# Patient Record
Sex: Female | Born: 1945 | Hispanic: No | Marital: Married | Smoking: Current every day smoker
Health system: Southern US, Community
[De-identification: ages and names within clinical notes are randomized; demographics above are authoritative.]

## PROBLEM LIST (undated history)

## (undated) DIAGNOSIS — M069 Rheumatoid arthritis, unspecified: Secondary | ICD-10-CM

## (undated) DIAGNOSIS — I639 Cerebral infarction, unspecified: Secondary | ICD-10-CM

## (undated) DIAGNOSIS — I10 Essential (primary) hypertension: Secondary | ICD-10-CM

## (undated) DIAGNOSIS — Z5189 Encounter for other specified aftercare: Secondary | ICD-10-CM

## (undated) DIAGNOSIS — H269 Unspecified cataract: Secondary | ICD-10-CM

## (undated) DIAGNOSIS — M1990 Unspecified osteoarthritis, unspecified site: Secondary | ICD-10-CM

## (undated) DIAGNOSIS — N200 Calculus of kidney: Secondary | ICD-10-CM

## (undated) HISTORY — PX: SHOULDER SURGERY: SHX246

## (undated) HISTORY — DX: Unspecified cataract: H26.9

## (undated) HISTORY — DX: Cerebral infarction, unspecified: I63.9

## (undated) HISTORY — PX: ABDOMINAL HYSTERECTOMY: SHX81

---

## 2014-07-02 ENCOUNTER — Emergency Department (HOSPITAL_BASED_OUTPATIENT_CLINIC_OR_DEPARTMENT_OTHER): Payer: Medicare HMO

## 2014-07-02 ENCOUNTER — Encounter (HOSPITAL_BASED_OUTPATIENT_CLINIC_OR_DEPARTMENT_OTHER): Payer: Self-pay | Admitting: *Deleted

## 2014-07-02 ENCOUNTER — Emergency Department (HOSPITAL_BASED_OUTPATIENT_CLINIC_OR_DEPARTMENT_OTHER)
Admission: EM | Admit: 2014-07-02 | Discharge: 2014-07-03 | Payer: Medicare HMO | Attending: Emergency Medicine | Admitting: Emergency Medicine

## 2014-07-02 DIAGNOSIS — Z87442 Personal history of urinary calculi: Secondary | ICD-10-CM | POA: Diagnosis not present

## 2014-07-02 DIAGNOSIS — M069 Rheumatoid arthritis, unspecified: Secondary | ICD-10-CM | POA: Insufficient documentation

## 2014-07-02 DIAGNOSIS — I10 Essential (primary) hypertension: Secondary | ICD-10-CM | POA: Insufficient documentation

## 2014-07-02 DIAGNOSIS — K1120 Sialoadenitis, unspecified: Secondary | ICD-10-CM | POA: Insufficient documentation

## 2014-07-02 DIAGNOSIS — Z720 Tobacco use: Secondary | ICD-10-CM | POA: Diagnosis not present

## 2014-07-02 DIAGNOSIS — Z859 Personal history of malignant neoplasm, unspecified: Secondary | ICD-10-CM | POA: Insufficient documentation

## 2014-07-02 DIAGNOSIS — M542 Cervicalgia: Secondary | ICD-10-CM | POA: Insufficient documentation

## 2014-07-02 DIAGNOSIS — R600 Localized edema: Secondary | ICD-10-CM | POA: Diagnosis present

## 2014-07-02 DIAGNOSIS — Z79899 Other long term (current) drug therapy: Secondary | ICD-10-CM | POA: Diagnosis not present

## 2014-07-02 HISTORY — DX: Rheumatoid arthritis, unspecified: M06.9

## 2014-07-02 HISTORY — DX: Unspecified osteoarthritis, unspecified site: M19.90

## 2014-07-02 HISTORY — DX: Calculus of kidney: N20.0

## 2014-07-02 HISTORY — DX: Essential (primary) hypertension: I10

## 2014-07-02 HISTORY — DX: Encounter for other specified aftercare: Z51.89

## 2014-07-02 LAB — BASIC METABOLIC PANEL
ANION GAP: 13 (ref 5–15)
BUN: 20 mg/dL (ref 6–23)
CALCIUM: 9.6 mg/dL (ref 8.4–10.5)
CO2: 28 mEq/L (ref 19–32)
Chloride: 103 mEq/L (ref 96–112)
Creatinine, Ser: 1.2 mg/dL — ABNORMAL HIGH (ref 0.50–1.10)
GFR calc non Af Amer: 45 mL/min — ABNORMAL LOW (ref >90)
GFR, EST AFRICAN AMERICAN: 52 mL/min — AB (ref >90)
Glucose, Bld: 94 mg/dL (ref 70–99)
Potassium: 4 mEq/L (ref 3.7–5.3)
SODIUM: 144 meq/L (ref 137–147)

## 2014-07-02 LAB — CBC WITH DIFFERENTIAL/PLATELET
BASOS ABS: 0 10*3/uL (ref 0.0–0.1)
BASOS PCT: 0 % (ref 0–1)
Eosinophils Absolute: 0.2 10*3/uL (ref 0.0–0.7)
Eosinophils Relative: 3 % (ref 0–5)
HEMATOCRIT: 46.5 % — AB (ref 36.0–46.0)
Hemoglobin: 15.1 g/dL — ABNORMAL HIGH (ref 12.0–15.0)
Lymphocytes Relative: 20 % (ref 12–46)
Lymphs Abs: 1.5 10*3/uL (ref 0.7–4.0)
MCH: 30.5 pg (ref 26.0–34.0)
MCHC: 32.5 g/dL (ref 30.0–36.0)
MCV: 93.9 fL (ref 78.0–100.0)
MONO ABS: 0.8 10*3/uL (ref 0.1–1.0)
Monocytes Relative: 10 % (ref 3–12)
NEUTROS ABS: 5 10*3/uL (ref 1.7–7.7)
Neutrophils Relative %: 67 % (ref 43–77)
PLATELETS: 194 10*3/uL (ref 150–400)
RBC: 4.95 MIL/uL (ref 3.87–5.11)
RDW: 13.1 % (ref 11.5–15.5)
WBC: 7.5 10*3/uL (ref 4.0–10.5)

## 2014-07-02 MED ORDER — CLINDAMYCIN PHOSPHATE 600 MG/50ML IV SOLN
600.0000 mg | Freq: Once | INTRAVENOUS | Status: AC
  Administered 2014-07-02: 600 mg via INTRAVENOUS
  Filled 2014-07-02: qty 50

## 2014-07-02 MED ORDER — IOHEXOL 300 MG/ML  SOLN
80.0000 mL | Freq: Once | INTRAMUSCULAR | Status: AC | PRN
  Administered 2014-07-02: 80 mL via INTRAVENOUS

## 2014-07-02 MED ORDER — SODIUM CHLORIDE 0.9 % IV SOLN
INTRAVENOUS | Status: DC
  Administered 2014-07-02: 20 mL/h via INTRAVENOUS

## 2014-07-02 NOTE — ED Notes (Signed)
Left side neck and facial swelling since 1630 today

## 2014-07-02 NOTE — ED Provider Notes (Signed)
CSN: [id number]     Arrival date & time 07/02/14  2051 History  This chart was scribed for Toy Baker, MD by Modena Jansky, ED Scribe. This patient was seen in room MH02/MH02 and the patient's care was started at 9:30 PM.   Chief Complaint  Patient presents with  . Facial Swelling   The history is provided by the patient. No language interpreter was used.   HPI Comments: Kristin Ochoa is a 68 y.o. female who presents to the Emergency Department complaining of left sided neck swelling that started today. She reports associated facial swelling that started today also. She states that the swelling had a sudden onset and that she had no symptoms yesterday. She states that nothing provides relief for her symptoms. She reports that she had no treatment PTA. She states that she has no prior hx of swelling. She reports that she has a hx of smoking. She denies any ear pain, diarrhea, emesis, fever, or trouble swallowing. No recent weight loss. Persistent and nothing makes them better.    Past Medical History  Diagnosis Date  . Arthritis   . Blood transfusion without reported diagnosis   . Cancer   . Hypertension   . RA (rheumatoid arthritis)   . Kidney stone    Past Surgical History  Procedure Laterality Date  . Abdominal hysterectomy    . Shoulder surgery     No family history on file. History  Substance Use Topics  . Smoking status: Current Every Day Smoker    Types: Cigarettes  . Smokeless tobacco: Never Used  . Alcohol Use: Yes     Comment: occasional   OB History    No data available     Review of Systems  Constitutional: Negative for fever and unexpected weight change.  HENT: Positive for facial swelling. Negative for ear pain.   Gastrointestinal: Negative for vomiting and diarrhea.  All other systems reviewed and are negative.   Allergies  Review of patient's allergies indicates no known allergies.  Home Medications   Prior to Admission medications   Medication  Sig Start Date End Date Taking? Authorizing Provider  Etanercept (ENBREL [place]) Inject into the skin.   Yes Historical Provider, MD  losartan-hydrochlorothiazide (HYZAAR) 100-12.5 MG per tablet Take 1 tablet by mouth daily.   Yes Historical Provider, MD  meclizine (ANTIVERT) 25 MG tablet Take 25 mg by mouth daily.   Yes Historical Provider, MD   BP 201/95 mmHg  Pulse 70  Temp(Src) 98.2 F (36.8 C) (Oral)  Resp 20  Ht 5' 3.5" (1.613 m)  Wt 148 lb (67.132 kg)  BMI 25.80 kg/m2  SpO2 96% Physical Exam  Constitutional: She is oriented to person, place, and time. She appears well-developed and well-nourished.  Non-toxic appearance. No distress.  HENT:  Head: Normocephalic and atraumatic.  Mouth/Throat: Oropharynx is clear and moist.  Eyes: Conjunctivae, EOM and lids are normal. Pupils are equal, round, and reactive to light.  Neck: Normal range of motion. Neck supple. No tracheal deviation present. No thyroid mass present.  Large mass at angle of mandible on left. Post oropharynx is clear. No supra clavicular adenopathy.   Cardiovascular: Normal rate, regular rhythm and normal heart sounds.  Exam reveals no gallop.   No murmur heard. Pulmonary/Chest: Effort normal and breath sounds normal. No stridor. No respiratory distress. She has no decreased breath sounds. She has no wheezes. She has no rhonchi. She has no rales.  Abdominal: Soft. Normal appearance and bowel sounds are  normal. She exhibits no distension. There is no tenderness. There is no rebound and no CVA tenderness.  Musculoskeletal: Normal range of motion. She exhibits no edema or tenderness.  Neurological: She is alert and oriented to person, place, and time. She has normal strength. No cranial nerve deficit or sensory deficit. GCS eye subscore is 4. GCS verbal subscore is 5. GCS motor subscore is 6.  Skin: Skin is warm and dry. No abrasion and no rash noted.  Psychiatric: She has a normal mood and affect. Her speech is normal and  behavior is normal.  Nursing note and vitals reviewed.   ED Course  Procedures (including critical care time) DIAGNOSTIC STUDIES: Oxygen Saturation is 96% on RA, normal by my interpretation.    COORDINATION OF CARE: 9:34 PM- Pt advised of plan for treatment which includes medication, radiology, and labs and pt agrees.  Labs Review Labs Reviewed  CBC WITH DIFFERENTIAL - Abnormal; Notable for the following:    Hemoglobin 15.1 (*)    HCT 46.5 (*)    All other components within normal limits  BASIC METABOLIC PANEL - Abnormal; Notable for the following:    Creatinine, Ser 1.20 (*)    GFR calc non Af Amer 45 (*)    GFR calc Af Amer 52 (*)    All other components within normal limits    Imaging Review No results found.   EKG Interpretation None      MDM   Final diagnoses:  Neck pain    I personally performed the services described in this documentation, which was scribed in my presence. The recorded information has been reviewed and is accurate.    Patient will be started on clindamycin here and transferred to [facility]  Toy Baker, MD 07/02/14 2327

## 2014-07-03 DIAGNOSIS — R600 Localized edema: Secondary | ICD-10-CM | POA: Diagnosis present

## 2014-07-03 DIAGNOSIS — Z79899 Other long term (current) drug therapy: Secondary | ICD-10-CM | POA: Diagnosis not present

## 2014-07-03 DIAGNOSIS — K1120 Sialoadenitis, unspecified: Secondary | ICD-10-CM | POA: Diagnosis not present

## 2014-07-03 DIAGNOSIS — I10 Essential (primary) hypertension: Secondary | ICD-10-CM | POA: Diagnosis not present

## 2014-07-03 DIAGNOSIS — Z720 Tobacco use: Secondary | ICD-10-CM | POA: Diagnosis not present

## 2014-07-03 DIAGNOSIS — Z859 Personal history of malignant neoplasm, unspecified: Secondary | ICD-10-CM | POA: Diagnosis not present

## 2014-07-03 DIAGNOSIS — Z87442 Personal history of urinary calculi: Secondary | ICD-10-CM | POA: Diagnosis not present

## 2014-07-03 DIAGNOSIS — M542 Cervicalgia: Secondary | ICD-10-CM | POA: Diagnosis not present

## 2014-07-03 DIAGNOSIS — M069 Rheumatoid arthritis, unspecified: Secondary | ICD-10-CM | POA: Diagnosis not present

## 2016-06-17 ENCOUNTER — Emergency Department (HOSPITAL_BASED_OUTPATIENT_CLINIC_OR_DEPARTMENT_OTHER)
Admission: EM | Admit: 2016-06-17 | Discharge: 2016-06-17 | Disposition: A | Discharge status: Home / Self Care | Payer: Medicare HMO | Attending: Emergency Medicine | Admitting: Emergency Medicine

## 2016-06-17 ENCOUNTER — Emergency Department (HOSPITAL_BASED_OUTPATIENT_CLINIC_OR_DEPARTMENT_OTHER): Payer: Medicare HMO

## 2016-06-17 ENCOUNTER — Encounter (HOSPITAL_BASED_OUTPATIENT_CLINIC_OR_DEPARTMENT_OTHER): Payer: Self-pay | Admitting: *Deleted

## 2016-06-17 DIAGNOSIS — R109 Unspecified abdominal pain: Secondary | ICD-10-CM

## 2016-06-17 DIAGNOSIS — F17210 Nicotine dependence, cigarettes, uncomplicated: Secondary | ICD-10-CM | POA: Diagnosis not present

## 2016-06-17 DIAGNOSIS — I10 Essential (primary) hypertension: Secondary | ICD-10-CM | POA: Insufficient documentation

## 2016-06-17 DIAGNOSIS — N390 Urinary tract infection, site not specified: Secondary | ICD-10-CM | POA: Insufficient documentation

## 2016-06-17 DIAGNOSIS — Z79899 Other long term (current) drug therapy: Secondary | ICD-10-CM | POA: Diagnosis not present

## 2016-06-17 LAB — BASIC METABOLIC PANEL
Anion gap: 7 (ref 5–15)
BUN: 18 mg/dL (ref 6–20)
CO2: 26 mmol/L (ref 22–32)
Calcium: 9.1 mg/dL (ref 8.9–10.3)
Chloride: 105 mmol/L (ref 101–111)
Creatinine, Ser: 1.38 mg/dL — ABNORMAL HIGH (ref 0.44–1.00)
GFR calc Af Amer: 43 mL/min — ABNORMAL LOW (ref >60)
GFR calc non Af Amer: 37 mL/min — ABNORMAL LOW (ref >60)
Glucose, Bld: 83 mg/dL (ref 65–99)
Potassium: 3.8 mmol/L (ref 3.5–5.1)
Sodium: 138 mmol/L (ref 135–145)

## 2016-06-17 LAB — URINALYSIS, ROUTINE W REFLEX MICROSCOPIC
Bilirubin Urine: NEGATIVE
Glucose, UA: NEGATIVE mg/dL
Hgb urine dipstick: NEGATIVE
Ketones, ur: NEGATIVE mg/dL
Nitrite: NEGATIVE
Protein, ur: NEGATIVE mg/dL
Specific Gravity, Urine: 1.006 (ref 1.005–1.030)
pH: 7 (ref 5.0–8.0)

## 2016-06-17 LAB — CBC WITH DIFFERENTIAL/PLATELET
Basophils Absolute: 0 10*3/uL (ref 0.0–0.1)
Basophils Relative: 1 %
EOS ABS: 0.3 10*3/uL (ref 0.0–0.7)
Eosinophils Relative: 7 %
HEMATOCRIT: 44.4 % (ref 36.0–46.0)
HEMOGLOBIN: 14.8 g/dL (ref 12.0–15.0)
LYMPHS PCT: 31 %
Lymphs Abs: 1.1 10*3/uL (ref 0.7–4.0)
MCH: 30.6 pg (ref 26.0–34.0)
MCHC: 33.3 g/dL (ref 30.0–36.0)
MCV: 91.7 fL (ref 78.0–100.0)
MONOS PCT: 24 %
Monocytes Absolute: 0.8 10*3/uL (ref 0.1–1.0)
NEUTROS PCT: 37 %
Neutro Abs: 1.3 10*3/uL — ABNORMAL LOW (ref 1.7–7.7)
Platelets: 191 10*3/uL (ref 150–400)
RBC: 4.84 MIL/uL (ref 3.87–5.11)
RDW: 13.1 % (ref 11.5–15.5)
WBC: 3.4 10*3/uL — ABNORMAL LOW (ref 4.0–10.5)

## 2016-06-17 LAB — URINE MICROSCOPIC-ADD ON

## 2016-06-17 MED ORDER — ONDANSETRON HCL 4 MG/2ML IJ SOLN
4.0000 mg | Freq: Once | INTRAMUSCULAR | Status: AC
  Administered 2016-06-17: 4 mg via INTRAVENOUS
  Filled 2016-06-17: qty 2

## 2016-06-17 MED ORDER — SODIUM CHLORIDE 0.9 % IV BOLUS (SEPSIS)
500.0000 mL | Freq: Once | INTRAVENOUS | Status: AC
  Administered 2016-06-17: 500 mL via INTRAVENOUS

## 2016-06-17 MED ORDER — FENTANYL CITRATE (PF) 100 MCG/2ML IJ SOLN
50.0000 ug | Freq: Once | INTRAMUSCULAR | Status: AC
  Administered 2016-06-17: 50 ug via INTRAVENOUS
  Filled 2016-06-17: qty 2

## 2016-06-17 MED ORDER — CEPHALEXIN 500 MG PO CAPS: 500.0000 mg | ORAL_CAPSULE | Freq: Two times a day (BID) | ORAL | 0 refills | Status: DC

## 2016-06-17 MED ORDER — CEPHALEXIN 250 MG PO CAPS
500.0000 mg | ORAL_CAPSULE | Freq: Once | ORAL | Status: AC
  Administered 2016-06-17: 500 mg via ORAL
  Filled 2016-06-17: qty 2

## 2016-06-17 MED ORDER — HYDROCODONE-ACETAMINOPHEN 5-325 MG PO TABS: 0.5000 | ORAL_TABLET | ORAL | 0 refills | Status: DC | PRN

## 2016-06-17 NOTE — ED Notes (Signed)
Returned from CT . States pain has improved after meds.

## 2016-06-17 NOTE — ED Notes (Signed)
MD with pt  

## 2016-06-17 NOTE — ED Provider Notes (Signed)
[facility] Provider Note: Kristin Dell, MD, FACEP  CSN: [id number] MRN: [id number] ARRIVAL: 06/17/16 at 0416 ROOM: MH11/MH11   CHIEF COMPLAINT  Back Pain   HISTORY OF PRESENT ILLNESS  Kristin Ochoa is a 70 y.o. female with a history of incidentally found kidney stone that has never been symptomatic. She is here with left-sided flank pain that began about 7 PM yesterday evening. The pain is intermittent and is described as sharp. She denies hematuria or difficulty urinating. She is very vague about how severe the pain is and whether it is changed with movement.    Past Medical History:  Diagnosis Date  . Arthritis   . Blood transfusion without reported diagnosis   . Hypertension   . Kidney stone   . RA (rheumatoid arthritis) (HCC)     Past Surgical History:  Procedure Laterality Date  . ABDOMINAL HYSTERECTOMY    . SHOULDER SURGERY      No family history on file.  Social History  Substance Use Topics  . Smoking status: Current Every Day Smoker    Types: Cigarettes  . Smokeless tobacco: Never Used  . Alcohol use Yes     Comment: occasional    Prior to Admission medications   Medication Sig Start Date End Date Taking? Authorizing Provider  metoprolol tartrate (LOPRESSOR) 25 MG tablet Take 25 mg by mouth 2 (two) times daily.   Yes Historical Provider, MD  cephALEXin (KEFLEX) 500 MG capsule Take 1 capsule (500 mg total) by mouth 2 (two) times daily. 06/17/16   Willem Klingensmith, MD  Etanercept (ENBREL [place]) Inject into the skin.    Historical Provider, MD  HYDROcodone-acetaminophen (NORCO) 5-325 MG tablet Take 0.5 tablets by mouth every 4 (four) hours as needed (for pain). 06/17/16   Aleatha Taite, MD  losartan-hydrochlorothiazide (HYZAAR) 100-12.5 MG per tablet Take 1 tablet by mouth daily.    Historical Provider, MD    Allergies Patient has no known allergies.   REVIEW OF SYSTEMS  Negative except as noted here or in the History of Present Illness.   PHYSICAL  EXAMINATION  Initial Vital Signs Blood pressure 186/93, pulse 63, temperature 97.7 F (36.5 C), temperature source Oral, resp. rate 20, height 5\' 4"  (1.626 m), weight 150 lb (68 kg), SpO2 97 %.  Examination General: Well-developed, well-nourished female in no acute distress; appearance consistent with age of record HENT: normocephalic; atraumatic; facial hirsuties Eyes: pupils equal, round and reactive to light; extraocular muscles intact; lens implants Neck: supple Heart: regular rate and rhythm Lungs: clear to auscultation bilaterally Abdomen: soft; nondistended; nontender; no masses or hepatosplenomegaly; bowel sounds present GU: No CVA tenderness Extremities: No deformity; full range of motion; pulses normal Neurologic: Awake, alert and oriented; motor function intact in all extremities and symmetric; no facial droop Skin: Warm and dry Psychiatric: Flat affect; poverty of speech   RESULTS  Summary of this visit's results, reviewed by myself:   EKG Interpretation  Date/Time:    Ventricular Rate:    PR Interval:    QRS Duration:   QT Interval:    QTC Calculation:   R Axis:     Text Interpretation:        Laboratory Studies: Results for orders placed or performed during the hospital encounter of 06/17/16 (from the past 24 hour(s))  Basic metabolic panel     Status: Abnormal   Collection Time: 06/17/16  4:55 AM  Result Value Ref Range   Sodium 138 135 - 145 mmol/L  Potassium 3.8 3.5 - 5.1 mmol/L   Chloride 105 101 - 111 mmol/L   CO2 26 22 - 32 mmol/L   Glucose, Bld 83 65 - 99 mg/dL   BUN 18 6 - 20 mg/dL   Creatinine, Ser 8.67 (H) 0.44 - 1.00 mg/dL   Calcium 9.1 8.9 - 67.2 mg/dL   GFR calc non Af Amer 37 (L) >60 mL/min   GFR calc Af Amer 43 (L) >60 mL/min   Anion gap 7 5 - 15  CBC with Differential     Status: Abnormal   Collection Time: 06/17/16  4:55 AM  Result Value Ref Range   WBC 3.4 (L) 4.0 - 10.5 K/uL   RBC 4.84 3.87 - 5.11 MIL/uL   Hemoglobin 14.8 12.0  - 15.0 g/dL   HCT 09.4 70.9 - 62.8 %   MCV 91.7 78.0 - 100.0 fL   MCH 30.6 26.0 - 34.0 pg   MCHC 33.3 30.0 - 36.0 g/dL   RDW 36.6 29.4 - 76.5 %   Platelets 191 150 - 400 K/uL   Neutrophils Relative % 37 %   Neutro Abs 1.3 (L) 1.7 - 7.7 K/uL   Lymphocytes Relative 31 %   Lymphs Abs 1.1 0.7 - 4.0 K/uL   Monocytes Relative 24 %   Monocytes Absolute 0.8 0.1 - 1.0 K/uL   Eosinophils Relative 7 %   Eosinophils Absolute 0.3 0.0 - 0.7 K/uL   Basophils Relative 1 %   Basophils Absolute 0.0 0.0 - 0.1 K/uL  Urinalysis, Routine w reflex microscopic (not at [facility])     Status: Abnormal   Collection Time: 06/17/16  5:05 AM  Result Value Ref Range   Color, Urine YELLOW YELLOW   APPearance CLEAR CLEAR   Specific Gravity, Urine 1.006 1.005 - 1.030   pH 7.0 5.0 - 8.0   Glucose, UA NEGATIVE NEGATIVE mg/dL   Hgb urine dipstick NEGATIVE NEGATIVE   Bilirubin Urine NEGATIVE NEGATIVE   Ketones, ur NEGATIVE NEGATIVE mg/dL   Protein, ur NEGATIVE NEGATIVE mg/dL   Nitrite NEGATIVE NEGATIVE   Leukocytes, UA MODERATE (A) NEGATIVE  Urine microscopic-add on     Status: Abnormal   Collection Time: 06/17/16  5:05 AM  Result Value Ref Range   Squamous Epithelial / LPF 0-5 (A) NONE SEEN   WBC, UA 6-30 0 - 5 WBC/hpf   RBC / HPF 0-5 0 - 5 RBC/hpf   Bacteria, UA FEW (A) NONE SEEN   Imaging Studies: Ct Renal Stone Study  Result Date: 06/17/2016 CLINICAL DATA:  Left-sided flank pain starting at 7 p.m. yesterday. History of hypertension and kidney stone. EXAM: CT ABDOMEN AND PELVIS WITHOUT CONTRAST TECHNIQUE: Multidetector CT imaging of the abdomen and pelvis was performed following the standard protocol without IV contrast. COMPARISON:  08/27/2012 FINDINGS: Lower chest: Lung bases are clear. Small amount of gas in the right heart likely resulting from intravenous injections. Hepatobiliary: No focal liver abnormality is seen. No gallstones, gallbladder wall thickening, or biliary dilatation. Pancreas: Unremarkable. No  pancreatic ductal dilatation or surrounding inflammatory changes. Spleen: Normal in size without focal abnormality. Adrenals/Urinary Tract: No adrenal gland nodules. Diffuse atrophy of the left kidney with large stone in the left lower pole measuring 6 x 14 mm. No change since previous study. No hydronephrosis or hydroureter. No ureteral stones identified. No bladder stone or bladder wall thickening. Stomach/Bowel: Stomach, small bowel, and colon are not abnormally distended. Stool fills the colon. Appendix is normal. No bowel wall thickening appreciated. Vascular/Lymphatic: Aortic  atherosclerosis. No enlarged abdominal or pelvic lymph nodes. Reproductive: Status post hysterectomy. No adnexal masses. Other: No abdominal wall hernia or abnormality. No abdominopelvic ascites. Musculoskeletal: Degenerative changes in the lumbar spine and hips. IMPRESSION: Nonobstructing intrarenal stone on the left and diffuse left renal atrophy. No change since prior study. Electronically Signed   By: Burman Nieves M.D.   On: 06/17/2016 05:55    ED COURSE  Nursing notes and initial vitals signs, including pulse oximetry, reviewed.  Vitals:   06/17/16 0448 06/17/16 0500 06/17/16 0556 06/17/16 0615  BP: 186/93 193/93 168/72 161/92  Pulse: 63 (!) 55 (!) 58 60  Resp:   14   Temp:      TempSrc:      SpO2: 97% 98% 95% 93%  Weight:      Height:       6:40 AM Pain significantly improved after that no 50 micrograms IV. She is currently off her anticoagulation pending a colonoscopy tomorrow. The cause of her left flank pain is not apparent on exam or CT. Her urinalysis is consistent with a early urinary tract infection and we will place her on an antibiotic for this. I do not think she has pyelonephritis as she is afebrile, has no leukocytosis and no CVA tenderness.   We will provide a small course of narcotic pain medication but she was advised that should her pain persist she needs to contact her primary care physician  for pain management.   PROCEDURES    ED DIAGNOSES     ICD-9-CM ICD-10-CM   1. Acute abdominal pain in left flank 789.09 R10.9    338.19    2. Lower urinary tract infection 599.0 N39.0        Paula Libra, MD 06/17/16 [id number]

## 2016-06-17 NOTE — ED Triage Notes (Addendum)
Pt c/o left sided flank that started around 7pm. Denies any n/v. States pain comes and goes and describes as sharp. States remote hx of kidney stone that was found when she had testing but denies any pain in the past from the stone. Denies any urinary symptoms.

## 2016-06-17 NOTE — ED Notes (Signed)
Pt in CT.

## 2016-06-18 LAB — URINE CULTURE

## 2018-02-25 ENCOUNTER — Emergency Department (HOSPITAL_BASED_OUTPATIENT_CLINIC_OR_DEPARTMENT_OTHER)
Admission: EM | Admit: 2018-02-25 | Discharge: 2018-02-25 | Disposition: A | Discharge status: Home / Self Care | Payer: Medicare Other | Attending: Emergency Medicine | Admitting: Emergency Medicine

## 2018-02-25 ENCOUNTER — Emergency Department (HOSPITAL_BASED_OUTPATIENT_CLINIC_OR_DEPARTMENT_OTHER): Payer: Medicare Other

## 2018-02-25 ENCOUNTER — Encounter (HOSPITAL_BASED_OUTPATIENT_CLINIC_OR_DEPARTMENT_OTHER): Payer: Self-pay

## 2018-02-25 ENCOUNTER — Other Ambulatory Visit: Payer: Self-pay

## 2018-02-25 DIAGNOSIS — I10 Essential (primary) hypertension: Secondary | ICD-10-CM | POA: Diagnosis not present

## 2018-02-25 DIAGNOSIS — F17210 Nicotine dependence, cigarettes, uncomplicated: Secondary | ICD-10-CM | POA: Diagnosis not present

## 2018-02-25 DIAGNOSIS — R109 Unspecified abdominal pain: Secondary | ICD-10-CM | POA: Diagnosis present

## 2018-02-25 DIAGNOSIS — N209 Urinary calculus, unspecified: Secondary | ICD-10-CM | POA: Diagnosis not present

## 2018-02-25 DIAGNOSIS — Z79899 Other long term (current) drug therapy: Secondary | ICD-10-CM | POA: Diagnosis not present

## 2018-02-25 DIAGNOSIS — N1330 Unspecified hydronephrosis: Secondary | ICD-10-CM | POA: Diagnosis not present

## 2018-02-25 DIAGNOSIS — N200 Calculus of kidney: Secondary | ICD-10-CM

## 2018-02-25 LAB — CBC WITH DIFFERENTIAL/PLATELET
BASOS ABS: 0 10*3/uL (ref 0.0–0.1)
Basophils Relative: 0 %
Eosinophils Absolute: 0.1 10*3/uL (ref 0.0–0.7)
Eosinophils Relative: 2 %
HCT: 46.2 % — ABNORMAL HIGH (ref 36.0–46.0)
Hemoglobin: 15.3 g/dL — ABNORMAL HIGH (ref 12.0–15.0)
Lymphocytes Relative: 14 %
Lymphs Abs: 1 10*3/uL (ref 0.7–4.0)
MCH: 31.2 pg (ref 26.0–34.0)
MCHC: 33.1 g/dL (ref 30.0–36.0)
MCV: 94.1 fL (ref 78.0–100.0)
MONO ABS: 0.7 10*3/uL (ref 0.1–1.0)
Monocytes Relative: 10 %
NEUTROS ABS: 5.2 10*3/uL (ref 1.7–7.7)
Neutrophils Relative %: 74 %
Platelets: 180 10*3/uL (ref 150–400)
RBC: 4.91 MIL/uL (ref 3.87–5.11)
RDW: 13.3 % (ref 11.5–15.5)
WBC: 6.9 10*3/uL (ref 4.0–10.5)

## 2018-02-25 LAB — URINALYSIS, ROUTINE W REFLEX MICROSCOPIC
Bilirubin Urine: NEGATIVE
GLUCOSE, UA: NEGATIVE mg/dL
KETONES UR: NEGATIVE mg/dL
NITRITE: NEGATIVE
PH: 6.5 (ref 5.0–8.0)
Protein, ur: 30 mg/dL — AB
Specific Gravity, Urine: 1.02 (ref 1.005–1.030)

## 2018-02-25 LAB — COMPREHENSIVE METABOLIC PANEL
ALT: 15 U/L (ref 0–44)
ANION GAP: 8 (ref 5–15)
AST: 21 U/L (ref 15–41)
Albumin: 4 g/dL (ref 3.5–5.0)
Alkaline Phosphatase: 76 U/L (ref 38–126)
BUN: 17 mg/dL (ref 8–23)
CHLORIDE: 105 mmol/L (ref 98–111)
CO2: 25 mmol/L (ref 22–32)
Calcium: 8.9 mg/dL (ref 8.9–10.3)
Creatinine, Ser: 1.24 mg/dL — ABNORMAL HIGH (ref 0.44–1.00)
GFR calc Af Amer: 49 mL/min — ABNORMAL LOW (ref >60)
GFR calc non Af Amer: 42 mL/min — ABNORMAL LOW (ref >60)
GLUCOSE: 81 mg/dL (ref 70–99)
POTASSIUM: 4 mmol/L (ref 3.5–5.1)
SODIUM: 138 mmol/L (ref 135–145)
Total Bilirubin: 0.5 mg/dL (ref 0.3–1.2)
Total Protein: 8.2 g/dL — ABNORMAL HIGH (ref 6.5–8.1)

## 2018-02-25 LAB — LIPASE, BLOOD: Lipase: 52 U/L — ABNORMAL HIGH (ref 11–51)

## 2018-02-25 LAB — URINALYSIS, MICROSCOPIC (REFLEX)

## 2018-02-25 MED ORDER — FENTANYL CITRATE (PF) 100 MCG/2ML IJ SOLN
50.0000 ug | Freq: Once | INTRAMUSCULAR | Status: AC
  Administered 2018-02-25: 50 ug via INTRAVENOUS
  Filled 2018-02-25: qty 2

## 2018-02-25 MED ORDER — CEPHALEXIN 500 MG PO CAPS: 500.0000 mg | ORAL_CAPSULE | Freq: Two times a day (BID) | ORAL | 0 refills | Status: AC

## 2018-02-25 MED ORDER — SODIUM CHLORIDE 0.9 % IV BOLUS
500.0000 mL | Freq: Once | INTRAVENOUS | Status: AC
  Administered 2018-02-25: 500 mL via INTRAVENOUS

## 2018-02-25 NOTE — ED Provider Notes (Signed)
[facility] EMERGENCY DEPARTMENT Provider Note   CSN: [id number] Arrival date & time: 02/25/18  1800     History   Chief Complaint Chief Complaint  Patient presents with  . Abdominal Pain    HPI Kristin Ochoa is a 72 y.o. female.  HPI  Pt is a 72 y/o female with a h/o hypertension, kidney stones, RA, CKD,  who presents to the ED today to be evaluated for left sided abdominal pain that began about 1.5 hours PTA. States pain is intermittent and feels sharp. Rates pain 10/10. Episodes last about 2-3 seconds at a time. Non-radiating. Denies nausea, vomiting, diarrhea, constipation, dysuria, frequency, urgency, or hematuria. No fevers, chest pain, or shortness of breath.   States she has not taken her lisinopril or metoprolol today. No headaches, lightheadedness, dizziness, vision changes, numbness/weakness.  Past Medical History:  Diagnosis Date  . Arthritis   . Blood transfusion without reported diagnosis   . Hypertension   . Kidney stone   . RA (rheumatoid arthritis) (HCC)     There are no active problems to display for this patient.   Past Surgical History:  Procedure Laterality Date  . ABDOMINAL HYSTERECTOMY    . SHOULDER SURGERY       OB History   None      Home Medications    Prior to Admission medications   Medication Sig Start Date End Date Taking? Authorizing Provider  etanercept (ENBREL [place]) 50 MG/ML injection Inject into the skin. 06/20/16  Yes [provider]  lisinopril (PRINIVIL,ZESTRIL) 40 MG tablet TAKE 1 TABLET(40 MG) BY MOUTH DAILY 12/08/17  Yes [provider]  metoprolol succinate (TOPROL-XL) 25 MG 24 hr tablet daily. 07/21/15  Yes [provider]  acetaminophen (TYLENOL) 650 MG CR tablet Take by mouth.    [provider]  cephALEXin (KEFLEX) 500 MG capsule Take 1 capsule (500 mg total) by mouth 2 (two) times daily for 7 days. 02/25/18 03/04/18  Payge Eppes S, PA-C  metoprolol tartrate  (LOPRESSOR) 25 MG tablet Take 25 mg by mouth 2 (two) times daily.    [provider]  Omega-3 Fatty Acids (FISH OIL) 1000 MG CAPS Take by mouth.    [provider]    Family History No family history on file.  Social History Social History   Tobacco Use  . Smoking status: Current Every Day Smoker    Types: Cigarettes  . Smokeless tobacco: Never Used  Substance Use Topics  . Alcohol use: Yes    Comment: occasional  . Drug use: No     Allergies   Patient has no known allergies.   Review of Systems Review of Systems  Constitutional: Negative for chills and fever.  HENT: Negative for ear pain and sore throat.   Eyes: Negative for pain and visual disturbance.  Respiratory: Negative for cough and shortness of breath.   Cardiovascular: Negative for chest pain and palpitations.  Gastrointestinal: Positive for abdominal pain. Negative for constipation, diarrhea, nausea and vomiting.  Genitourinary: Negative for dysuria, flank pain, frequency, hematuria and urgency.  Musculoskeletal: Negative for back pain.  Skin: Negative for color change and rash.  Neurological: Negative for seizures, syncope and headaches.  All other systems reviewed and are negative.    Physical Exam Updated Vital Signs BP (!) 200/94 (BP Location: Left Arm) Comment: noncompliant  Pulse 71   Temp 98.5 F (36.9 C) (Oral)   Resp 20   Ht 5\' 3"  (1.6 m)   Wt 65.8 kg (  145 lb)   SpO2 98%   BMI 25.69 kg/m    Physical Exam  Constitutional: She appears well-developed and well-nourished.  Non-toxic appearance.  HENT:  Head: Normocephalic and atraumatic.  Eyes: Conjunctivae are normal.  Neck: Neck supple.  Cardiovascular: Normal rate, regular rhythm, normal heart sounds and intact distal pulses.  No murmur heard. Distal pulses are symmetric to BUE and BLE  Pulmonary/Chest: Effort normal and breath sounds normal. No stridor. No respiratory distress. She has no wheezes. She has no rales.    Abdominal: Soft. Bowel sounds are normal. There is no rigidity, no rebound and no guarding.  LUQ TTP. Left CVA TTP.  Musculoskeletal: She exhibits no edema.  Neurological: She is alert.  Skin: Skin is warm and dry. Capillary refill takes less than 2 seconds.  Psychiatric: She has a normal mood and affect.  Nursing note and vitals reviewed.  ED Treatments / Results  Labs (all labs ordered are listed, but only abnormal results are displayed) Labs Reviewed  CBC WITH DIFFERENTIAL/PLATELET - Abnormal; Notable for the following components:      Result Value   Hemoglobin 15.3 (*)    HCT 46.2 (*)    All other components within normal limits  COMPREHENSIVE METABOLIC PANEL - Abnormal; Notable for the following components:   Creatinine, Ser 1.24 (*)    Total Protein 8.2 (*)    GFR calc non Af Amer 42 (*)    GFR calc Af Amer 49 (*)    All other components within normal limits  LIPASE, BLOOD - Abnormal; Notable for the following components:   Lipase 52 (*)    All other components within normal limits  URINALYSIS, ROUTINE W REFLEX MICROSCOPIC - Abnormal; Notable for the following components:   APPearance HAZY (*)    Hgb urine dipstick LARGE (*)    Protein, ur 30 (*)    Leukocytes, UA TRACE (*)    All other components within normal limits  URINALYSIS, MICROSCOPIC (REFLEX) - Abnormal; Notable for the following components:   Bacteria, UA FEW (*)    All other components within normal limits  URINE CULTURE    EKG None  Radiology Ct Renal Stone Study  Result Date: 02/25/2018 CLINICAL DATA:  Left flank pain onset today EXAM: CT ABDOMEN AND PELVIS WITHOUT CONTRAST TECHNIQUE: Multidetector CT imaging of the abdomen and pelvis was performed following the standard protocol without IV contrast. COMPARISON:  Multiple exams, including 06/17/2016 and radiograph from 09/16/2017 FINDINGS: Lower chest: Descending thoracic aortic atherosclerotic calcification. Hepatobiliary: Unremarkable Pancreas:  Unremarkable Spleen: Unremarkable Adrenals/Urinary Tract: Adrenal glands unremarkable. Notable asymmetric perirenal stranding noted with prominent hydronephrosis, dilated collecting system, and minimal if any hydroureter. 2.2 by 1.2 by 0.3 cm calcification in the left kidney lower pole, with a flat morphology. No other urinary tract calculi are observed. The right kidney appears normal. Urinary bladder normal. Stomach/Bowel: Sigmoid colon diverticulosis. No active diverticulitis. Appendix normal. Vascular/Lymphatic: Aortoiliac atherosclerotic vascular disease. Reproductive: The adnexa are somewhat indistinct. Uterus appears to be absent. Other: No supplemental non-categorized findings. Musculoskeletal: Mild lower lumbar spondylosis. IMPRESSION: 1. 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 There is a large flat left kidney lower pole nonobstructive renal calculus not contributing to the apparent obstruction. Left perirenal stranding is present along with left renal parenchymal atrophy. 2. Other imaging findings of potential clinical significance: Aortic Atherosclerosis (ICD10-I70.0). Sigmoid colon diverticulosis. Electronically Signed   By: Gaylyn Rong M.D.   On: 02/25/2018 19:52    Procedures Procedures (including critical care  time)  Medications Ordered in ED Medications  fentaNYL (SUBLIMAZE) injection 50 mcg (50 mcg Intravenous Given 02/25/18 1853)  sodium chloride 0.9 % bolus 500 mL (0 mLs Intravenous Stopped 02/25/18 2035)     Initial Impression / Assessment and Plan / ED Course  I have reviewed the triage vital signs and the nursing notes.  Pertinent labs & imaging results that were available during my care of the patient were reviewed by me and considered in my medical decision making (see chart for details).    Discussed pt presentation and exam findings with Dr. Particia Nearing, who evaluated pt and agrees with the plan to discharge pt with close outpt f/u with  urology.   Final Clinical Impressions(s) / ED Diagnoses   Final diagnoses:  Hydronephrosis, unspecified hydronephrosis type  Renal calculus  Hypertension, unspecified type   Patient presented with left-sided abdominal pain.  Hypertensive to 200 systolic, but otherwise vital signs are within normal limits.  No chest pain, shortness of breath, headache, lightheadedness, vision changes or numbness or weakness to suggest hypertensive emergency.  On review of prior records patient appears to have consistently high blood pressures.  She states she did not take her blood pressure medications today.  I advised her to take her blood pressure medications when she gets home.  Her abdominal exam she has a mild left upper quadrant and moderate CVA tenderness to the left side.  Labs show normal cbc, cmp with creatinine at 1.24, consistent with prior. Normal lfts and electrolytes. Lipase mildly elevated. UA with trace leukocytes, hematuria, 11-20 RBC, 21-50 WBC, and few bacteria. Will give rx for keflex and send culture. pts pt improved after fentanyl in the ED and she is now in NAD on re-exam. Has tolerated PO in the ED. 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 Also with nonobstructive renal calculus. Discussed findings with patient and that she may have recently passed a stone or that she may have a ureteral stricture. Advised that she needs to f/u with urology this week for re-eval and to return if worse. All questions answered and pt and family at bedside understand plan and reasons to return.   ED Discharge Orders        Ordered    cephALEXin (KEFLEX) 500 MG capsule  2 times daily     02/25/18 2044      Rayne Du 02/25/18 2051  Jacalyn Lefevre, MD 02/26/18 1655

## 2018-02-25 NOTE — ED Triage Notes (Signed)
C/o left side abd pain x 1 hour-pt grimacing-denies n/v/d

## 2018-02-25 NOTE — ED Notes (Signed)
Sprite provided for po challenge 

## 2018-02-25 NOTE — Discharge Instructions (Addendum)
You were given a prescription for antibiotics. Please take the antibiotic prescription fully.   Please take your blood pressure medications when you get home and follow up with your regular doctor for a blood pressure recheck as it was noted to be high today.   Please follow up with the urologist this week for re-evaluation. Please return to the emergency room for any new or worsening symptoms.

## 2018-02-27 LAB — URINE CULTURE

## 2018-09-17 IMAGING — CT CT RENAL STONE PROTOCOL
2 of 4 series · 17 of 46 positions shown, 19 images · non-contrast
Comparison: 08/27/2012

CLINICAL DATA: Left-sided flank pain starting at 7 p.m. yesterday.
History of hypertension and kidney stone.

EXAM:
CT ABDOMEN AND PELVIS WITHOUT CONTRAST
TECHNIQUE: Multidetector CT imaging of the abdomen and pelvis was performed
following the standard protocol without IV contrast.

[Series 2: axial st · axial · 0.72mm/px · z∈[+338,+708]mm · 14 of 82 slices shown, 16 images]
[im 4/82  soft-tissue]
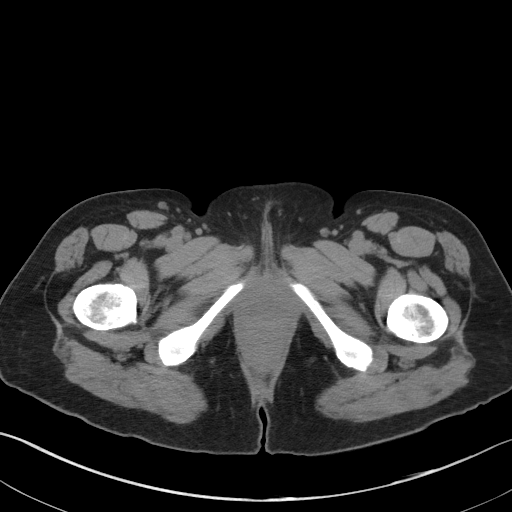
[im 4/82  bone]
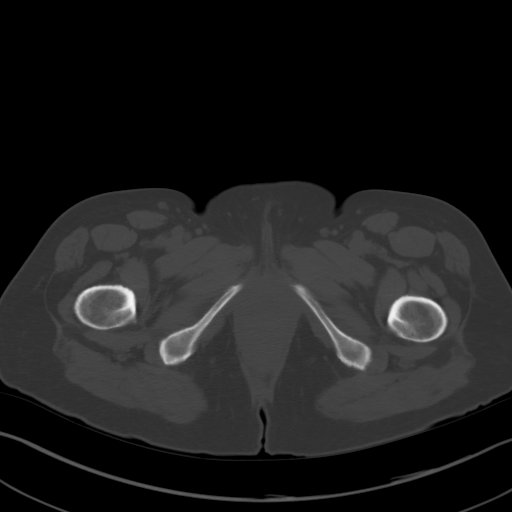
[im 11/82  soft-tissue]
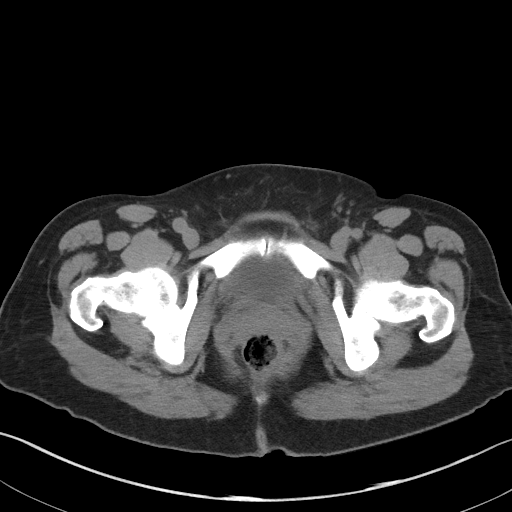
[im 17/82  soft-tissue]
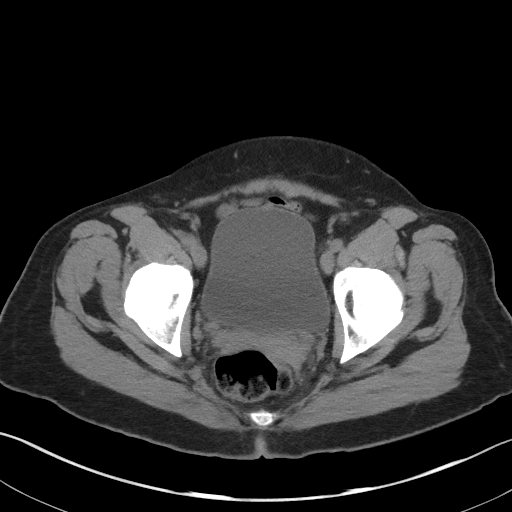
[im 21/82  soft-tissue]
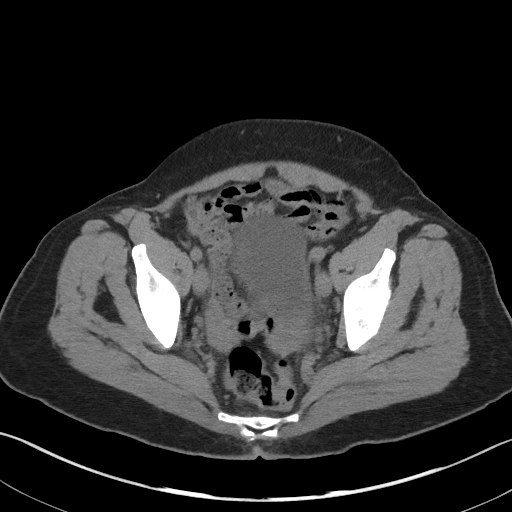
[im 28/82  soft-tissue]
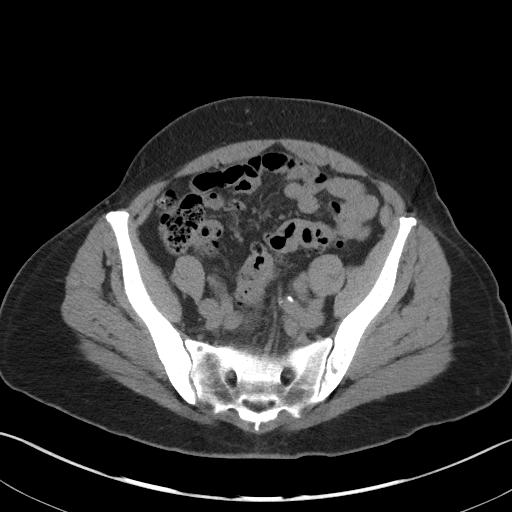
[im 34/82  soft-tissue]
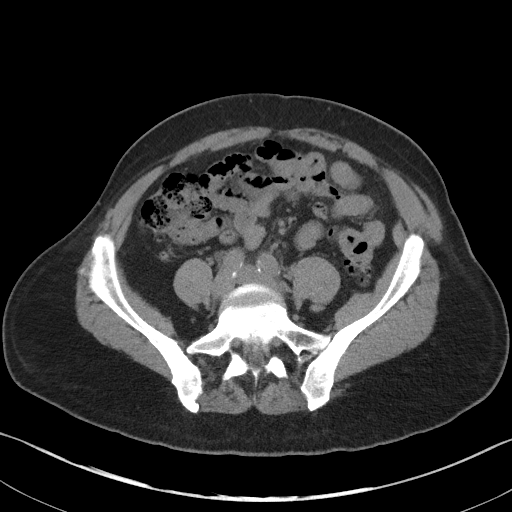
[im 38/82  soft-tissue]
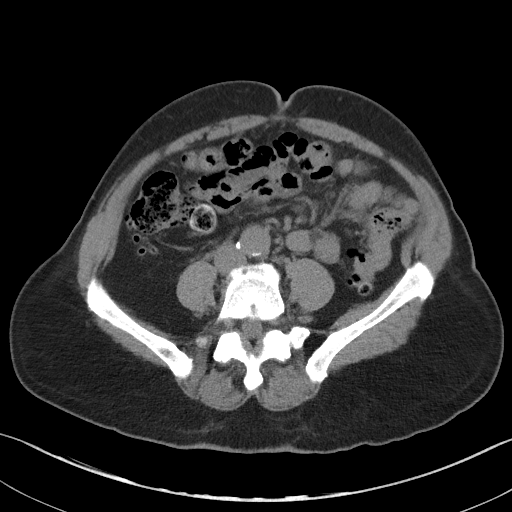
[im 44/82  soft-tissue]
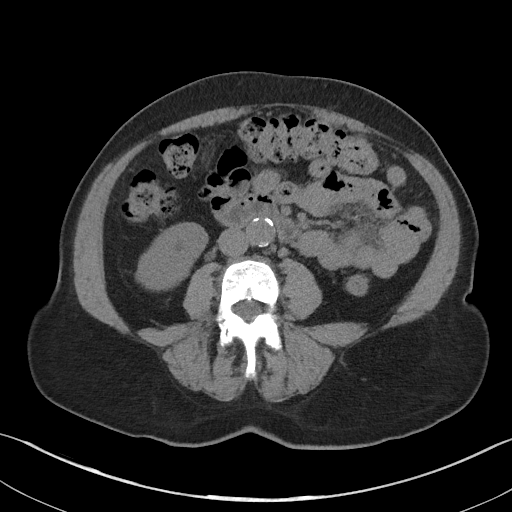
[im 48/82  soft-tissue]
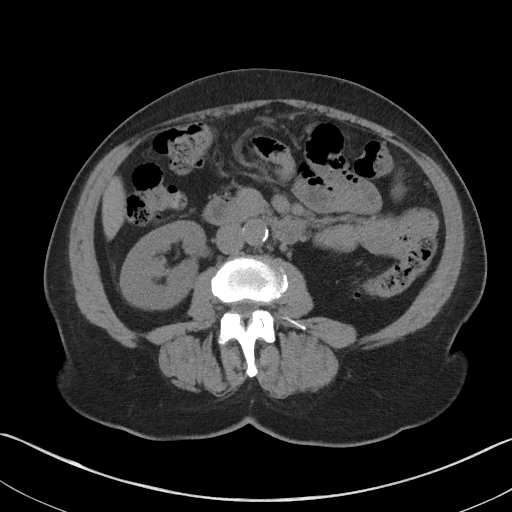
[im 48/82  bone]
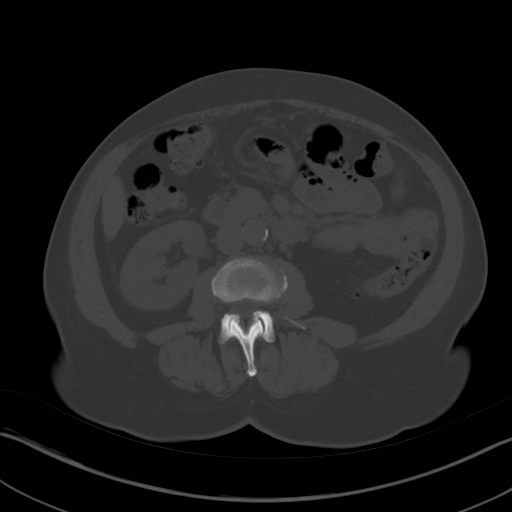
[im 55/82  soft-tissue]
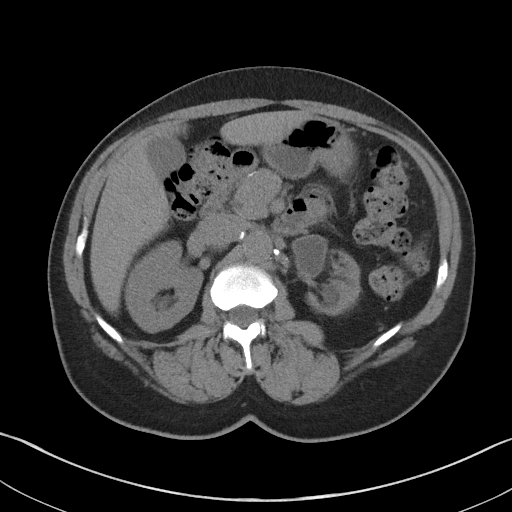
[im 61/82  soft-tissue]
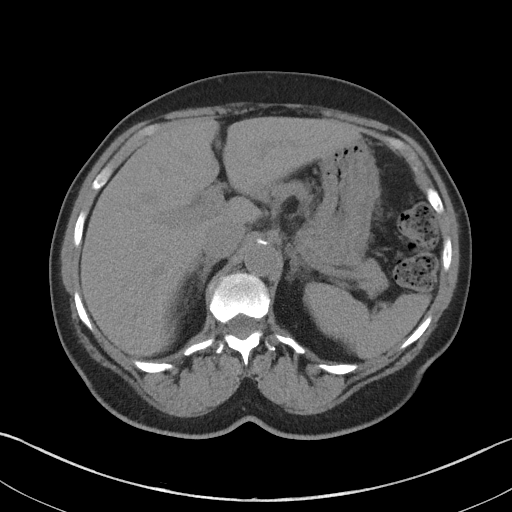
[im 65/82  soft-tissue]
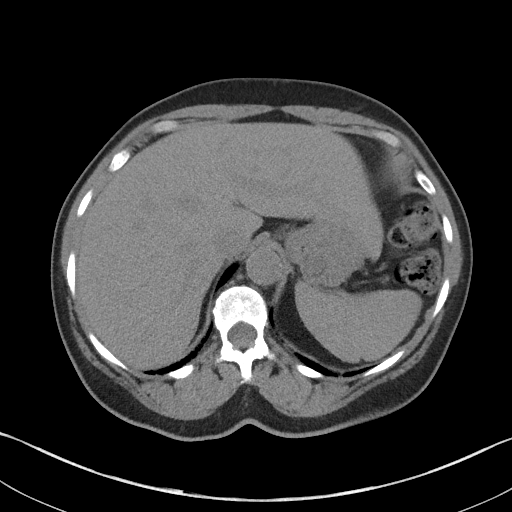
[im 71/82  soft-tissue]
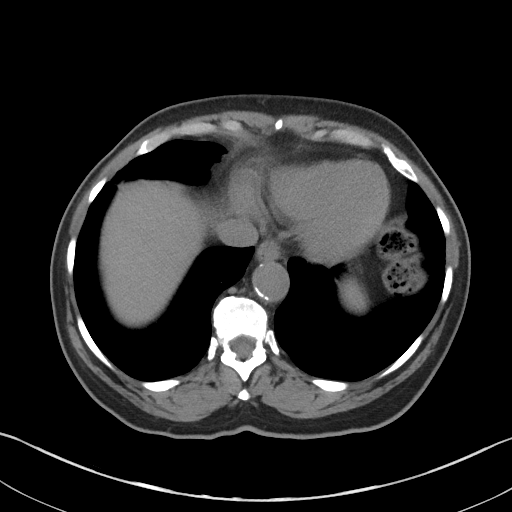
[im 78/82  soft-tissue]
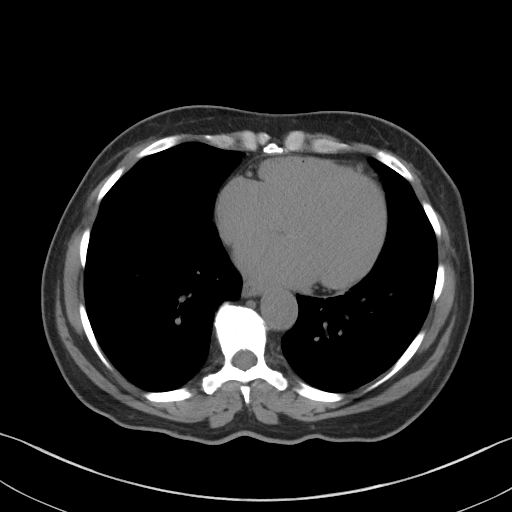

[Series 4: coronal st · coronal · 0.76mm/px · 3 of 101 slices shown]
[im 34/101  soft-tissue]
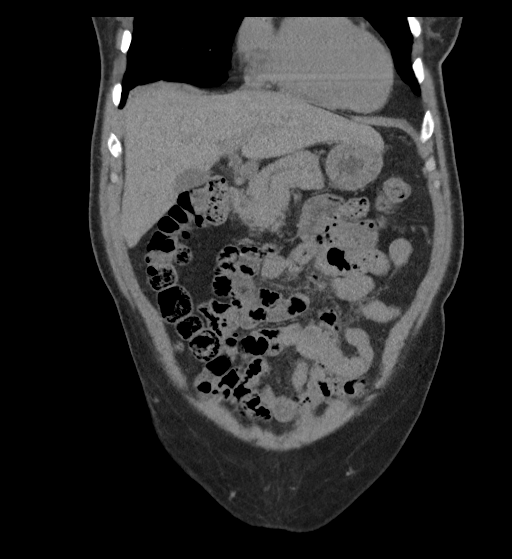
[im 45/101  soft-tissue]
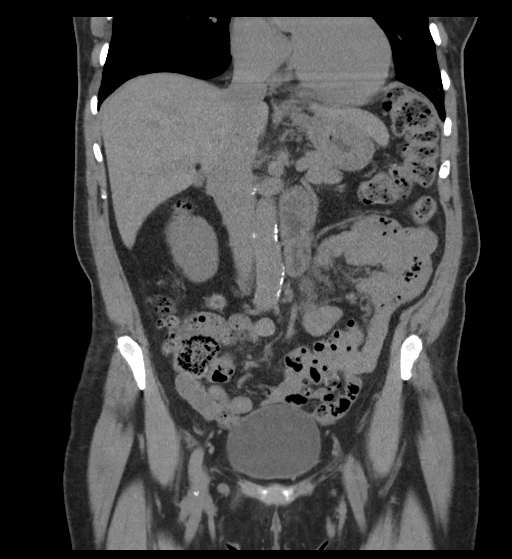
[im 56/101  soft-tissue]
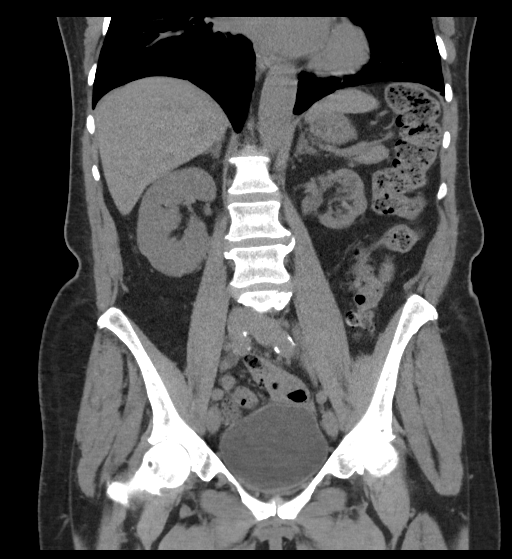

[17 of 46 positions shown; findings below may reference images not displayed]

FINDINGS: Lower chest: Lung bases are clear. Small amount of gas in the right
heart likely resulting from intravenous injections.

Hepatobiliary: No focal liver abnormality is seen. No gallstones,
gallbladder wall thickening, or biliary dilatation.

Pancreas: Unremarkable. No pancreatic ductal dilatation or
surrounding inflammatory changes.

Spleen: Normal in size without focal abnormality.

Adrenals/Urinary Tract: No adrenal gland nodules. Diffuse atrophy of
the left kidney with large stone in the left lower pole measuring 6
x 14 mm. No change since previous study. No hydronephrosis or
hydroureter. No ureteral stones identified. No bladder stone or
bladder wall thickening.

Stomach/Bowel: Stomach, small bowel, and colon are not abnormally
distended. Stool fills the colon. Appendix is normal. No bowel wall
thickening appreciated.

Vascular/Lymphatic: Aortic atherosclerosis. No enlarged abdominal or
pelvic lymph nodes.

Reproductive: Status post hysterectomy. No adnexal masses.

Other: No abdominal wall hernia or abnormality. No abdominopelvic
ascites.

Musculoskeletal: Degenerative changes in the lumbar spine and hips.
IMPRESSION: Nonobstructing intrarenal stone on the left and diffuse left renal
atrophy. No change since prior study.

## 2020-01-26 ENCOUNTER — Emergency Department (HOSPITAL_BASED_OUTPATIENT_CLINIC_OR_DEPARTMENT_OTHER): Payer: Medicare Other

## 2020-01-26 ENCOUNTER — Encounter (HOSPITAL_BASED_OUTPATIENT_CLINIC_OR_DEPARTMENT_OTHER): Payer: Self-pay | Admitting: *Deleted

## 2020-01-26 ENCOUNTER — Emergency Department (HOSPITAL_BASED_OUTPATIENT_CLINIC_OR_DEPARTMENT_OTHER)
Admission: EM | Admit: 2020-01-26 | Discharge: 2020-01-26 | Disposition: A | Discharge status: Home / Self Care | Payer: Medicare Other | Attending: Emergency Medicine | Admitting: Emergency Medicine

## 2020-01-26 ENCOUNTER — Other Ambulatory Visit: Payer: Self-pay

## 2020-01-26 DIAGNOSIS — I10 Essential (primary) hypertension: Secondary | ICD-10-CM | POA: Insufficient documentation

## 2020-01-26 DIAGNOSIS — R079 Chest pain, unspecified: Secondary | ICD-10-CM

## 2020-01-26 DIAGNOSIS — R600 Localized edema: Secondary | ICD-10-CM | POA: Insufficient documentation

## 2020-01-26 DIAGNOSIS — F17210 Nicotine dependence, cigarettes, uncomplicated: Secondary | ICD-10-CM | POA: Diagnosis not present

## 2020-01-26 LAB — CBC WITH DIFFERENTIAL/PLATELET
Abs Immature Granulocytes: 0.01 10*3/uL (ref 0.00–0.07)
Basophils Absolute: 0 10*3/uL (ref 0.0–0.1)
Basophils Relative: 1 %
Eosinophils Absolute: 0.4 10*3/uL (ref 0.0–0.5)
Eosinophils Relative: 9 %
HCT: 44.6 % (ref 36.0–46.0)
Hemoglobin: 13.9 g/dL (ref 12.0–15.0)
Immature Granulocytes: 0 %
Lymphocytes Relative: 26 %
Lymphs Abs: 1.1 10*3/uL (ref 0.7–4.0)
MCH: 31 pg (ref 26.0–34.0)
MCHC: 31.2 g/dL (ref 30.0–36.0)
MCV: 99.6 fL (ref 80.0–100.0)
Monocytes Absolute: 0.6 10*3/uL (ref 0.1–1.0)
Monocytes Relative: 14 %
Neutro Abs: 2 10*3/uL (ref 1.7–7.7)
Neutrophils Relative %: 50 %
Platelets: 223 10*3/uL (ref 150–400)
RBC: 4.48 MIL/uL (ref 3.87–5.11)
RDW: 13.4 % (ref 11.5–15.5)
WBC: 4.1 10*3/uL (ref 4.0–10.5)
nRBC: 0 % (ref 0.0–0.2)

## 2020-01-26 LAB — COMPREHENSIVE METABOLIC PANEL
ALT: 19 U/L (ref 0–44)
AST: 28 U/L (ref 15–41)
Albumin: 3.9 g/dL (ref 3.5–5.0)
Alkaline Phosphatase: 76 U/L (ref 38–126)
Anion gap: 8 (ref 5–15)
BUN: 13 mg/dL (ref 8–23)
CO2: 28 mmol/L (ref 22–32)
Calcium: 8.9 mg/dL (ref 8.9–10.3)
Chloride: 105 mmol/L (ref 98–111)
Creatinine, Ser: 1.32 mg/dL — ABNORMAL HIGH (ref 0.44–1.00)
GFR calc Af Amer: 46 mL/min — ABNORMAL LOW (ref >60)
GFR calc non Af Amer: 39 mL/min — ABNORMAL LOW (ref >60)
Glucose, Bld: 93 mg/dL (ref 70–99)
Potassium: 3.8 mmol/L (ref 3.5–5.1)
Sodium: 141 mmol/L (ref 135–145)
Total Bilirubin: 0.6 mg/dL (ref 0.3–1.2)
Total Protein: 7.3 g/dL (ref 6.5–8.1)

## 2020-01-26 LAB — TROPONIN I (HIGH SENSITIVITY)
Troponin I (High Sensitivity): 8 ng/L (ref <18)
Troponin I (High Sensitivity): 8 ng/L (ref <18)

## 2020-01-26 LAB — BRAIN NATRIURETIC PEPTIDE: B Natriuretic Peptide: 61.7 pg/mL (ref 0.0–100.0)

## 2020-01-26 LAB — LIPASE, BLOOD: Lipase: 51 U/L (ref 11–51)

## 2020-01-26 NOTE — ED Triage Notes (Signed)
Pc/o mid sternal chest pain x 2 episodes today lasting 5 mins, denies SOB , nausea

## 2020-01-26 NOTE — Discharge Instructions (Signed)
Your work-up today was overall reassuring and appeared similar to prior.  Please follow-up with your primary doctor and likely cardiology as well for further work-up and management of your chest discomfort.  Please rest and stay hydrated.  If any symptoms change or worsen, please return to the nearest emergency department.

## 2020-01-26 NOTE — ED Notes (Signed)
Patient up to RR minimal assist.  Back to room 7 con't cardiacmonitor.

## 2020-01-26 NOTE — ED Provider Notes (Signed)
3:37 PM Care assumed from Dr. Clarice Pole.  At time of transfer of care, patient is awaiting results of BNP and delta troponin prior to disposition determination for the patient with intermittent episodes of chest pain today.  If work-up is completely reassuring, plan of care will be to discussed discharge with close PCP follow-up whom she saw yesterday and is going to see later this week as well.  5:42 PM BNP returned normal and troponin is unchanged and negative.  Went to discuss with the patient that her work-up was overall reassuring and patient would like to try going home.  Based on her 4 hours of observation without any symptoms or any concerning findings, we do feel she is safe for discharge to follow-up with her PCP in the next 24 to 48 hours as well as likely cardiology follow-up as well.   She reports since has been here she has had no other symptoms and is feeling much better.   Patient agrees with this plan and return precautions.  Patient had other questions or concerns and was discharged in good condition.    Clinical Impression: 1. Chest pain, unspecified type     Disposition: Discharge  Condition: Good  I have discussed the results, Dx and Tx plan with the pt(& family if present). He/she/they expressed understanding and agree(s) with the plan. Discharge instructions discussed at great length. Strict return precautions discussed and pt &/or family have verbalized understanding of the instructions. No further questions at time of discharge.    New Prescriptions   No medications on file    Follow Up: Ananias Pilgrim, MD [address] [place] [address] [phone]     [facility] [place] [facility] [address] [id number] mc [address] [place] [address] [phone]    [facility] CARDIOVASCULAR DIVISION [address] [place] [address] [phone]        Cassity Christian, Canary Brim, MD 01/26/20 [phone]

## 2020-01-26 NOTE — ED Provider Notes (Signed)
[facility] EMERGENCY DEPARTMENT Provider Note   CSN: [id number] Arrival date & time: 01/26/20  1324     History Chief Complaint  Patient presents with  . Chest Pain    Kristin Ochoa is a 74 y.o. female.  HPI Patient had 2 episodes of chest pain today.  She reports it felt like a fullness up under her chest.  She indicates diffusely along the lower breast and epigastrium.  Patient reports it felt like a "gas pain".  She denies feeling short of breath with these episodes.  However, she reports she has felt short of breath for quite a while particularly with exertion.  She also notes that she has been experiencing some swelling in her legs.  No fevers, no chills, no cough, no hemoptysis.  Patient denies similar chest pain in the past.  She denies significant history of heart disease or other diagnostic testing.    Past Medical History:  Diagnosis Date  . Arthritis   . Blood transfusion without reported diagnosis   . Hypertension   . Kidney stone   . RA (rheumatoid arthritis) (HCC)     There are no problems to display for this patient.   Past Surgical History:  Procedure Laterality Date  . ABDOMINAL HYSTERECTOMY    . SHOULDER SURGERY       OB History   No obstetric history on file.     No family history on file.  Social History   Tobacco Use  . Smoking status: Current Every Day Smoker    Types: Cigarettes  . Smokeless tobacco: Never Used  Substance Use Topics  . Alcohol use: Yes    Comment: occasional  . Drug use: No    Home Medications Prior to Admission medications   Medication Sig Start Date End Date Taking? Authorizing Provider  acetaminophen (TYLENOL) 650 MG CR tablet Take by mouth.    [provider]  etanercept (ENBREL SURECLICK) 50 MG/ML injection Inject into the skin. 06/20/16   [provider]  lisinopril (PRINIVIL,ZESTRIL) 40 MG tablet TAKE 1 TABLET(40 MG) BY MOUTH DAILY 12/08/17   [provider]  metoprolol  succinate (TOPROL-XL) 25 MG 24 hr tablet daily. 07/21/15   [provider]  metoprolol tartrate (LOPRESSOR) 25 MG tablet Take 25 mg by mouth 2 (two) times daily.    [provider]  Omega-3 Fatty Acids (FISH OIL) 1000 MG CAPS Take by mouth.    [provider]    Allergies    Patient has no known allergies.  Review of Systems   Review of Systems 10 systems reviewed and negative except per HPI Physical Exam Updated Vital Signs BP (!) 144/76 (BP Location: Right Arm)   Pulse 62   Temp 97.9 F (36.6 C)   Resp 20   Ht 5\' 4"  (1.626 m)   Wt 75.8 kg   SpO2 98%   BMI 28.67 kg/m   Physical Exam Constitutional:      Comments: Alert and nontoxic.  No respiratory distress at rest.  Eyes:     Extraocular Movements: Extraocular movements intact.  Cardiovascular:     Rate and Rhythm: Normal rate and regular rhythm.  Pulmonary:     Comments: Fine crackles at the lung bases.  Good airflow.  No wheezes no rhonchi. Abdominal:     General: There is no distension.     Palpations: Abdomen is soft.     Tenderness: There is no abdominal tenderness. There is no guarding.  Musculoskeletal:  Comments: Trace to 1+ edema of the feet and lower legs.  Calves nontender.  Neurological:     General: No focal deficit present.     Mental Status: She is oriented to person, place, and time.     Coordination: Coordination normal.  Psychiatric:        Mood and Affect: Mood normal.     ED Results / Procedures / Treatments   Labs (all labs ordered are listed, but only abnormal results are displayed) Labs Reviewed  COMPREHENSIVE METABOLIC PANEL - Abnormal; Notable for the following components:      Result Value   Creatinine, Ser 1.32 (*)    GFR calc non Af Amer 39 (*)    GFR calc Af Amer 46 (*)    All other components within normal limits  LIPASE, BLOOD  CBC WITH DIFFERENTIAL/PLATELET  BRAIN NATRIURETIC PEPTIDE  TROPONIN I (HIGH SENSITIVITY)  TROPONIN I (HIGH  SENSITIVITY)    EKG EKG Interpretation  Date/Time:  Wednesday January 26 2020 13:25:12 EDT Ventricular Rate:  64 PR Interval:  186 QRS Duration: 96 QT Interval:  406 QTC Calculation: 418 R Axis:   -5 Text Interpretation: Normal sinus rhythm Incomplete right bundle branch block Minimal voltage criteria for LVH, may be normal variant ( Cornell product ) Anteroseptal infarct , age undetermined T wave abnormality, consider inferolateral ischemia Abnormal ECG agree. no old Confirmed by Arby Barrette [phone]) on 01/26/2020 1:55:29 PM   Radiology DG Chest Port 1 View  Result Date: 01/26/2020 CLINICAL DATA:  Chest pain EXAM: PORTABLE CHEST 1 VIEW COMPARISON:  Jan 08, 2020 FINDINGS: Lungs are clear. Heart is upper normal in size with pulmonary vascularity normal. No adenopathy. There is aortic atherosclerosis. There is degenerative change in the thoracic spine. There is also degenerative change in each shoulder. IMPRESSION: Lungs clear. Heart upper normal in size. Aortic Atherosclerosis (ICD10-I70.0). Electronically Signed   By: Bretta Bang III M.D.   On: 01/26/2020 14:37    Procedures Procedures (including critical care time)  Medications Ordered in ED Medications - No data to display  ED Course  I have reviewed the triage vital signs and the nursing notes.  Pertinent labs & imaging results that were available during my care of the patient were reviewed by me and considered in my medical decision making (see chart for details).    MDM Rules/Calculators/A&P                          Patient does have history of hypertension, no significant cardiac history.  Chest pain does sound atypical however with some risk factors and age, will proceed with troponins, BNP and chest x-rays.  If troponin is flat and BNP normal, patient may be appropriate for discharge and follow-up.  Dr. Julieanne Manson to review remainder of results for final disposition. Final Clinical Impression(s) / ED Diagnoses Final  diagnoses:  None    Rx / DC Orders ED Discharge Orders    None       Arby Barrette, MD 01/26/20 [phone]

## 2020-05-27 IMAGING — CT CT RENAL STONE PROTOCOL
2 of 4 series · 16 of 46 positions shown, 18 images · non-contrast
Comparison: Multiple exams, including 06/17/2016 and radiograph
from 09/16/2017

CLINICAL DATA: Left flank pain onset today

EXAM:
CT ABDOMEN AND PELVIS WITHOUT CONTRAST
TECHNIQUE: Multidetector CT imaging of the abdomen and pelvis was performed
following the standard protocol without IV contrast.

[Series 2: axial st · axial · 0.69mm/px · z∈[-398,+32]mm · 13 of 94 slices shown, 15 images]
[im 4/94  soft-tissue]
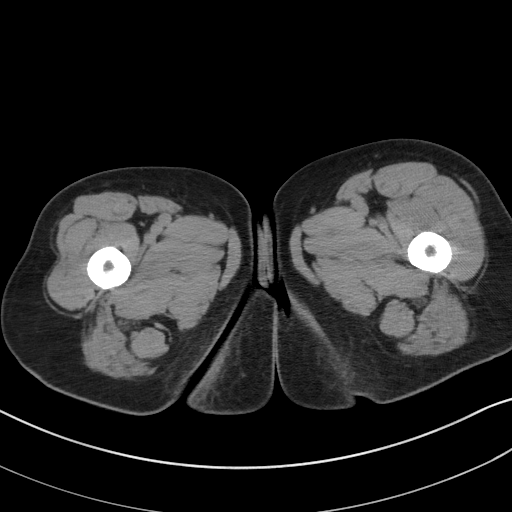
[im 4/94  bone]
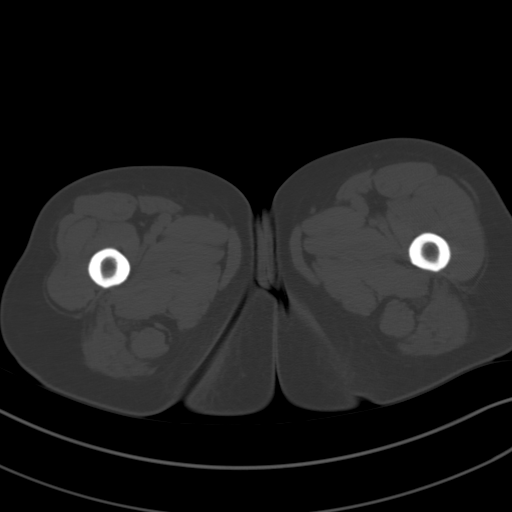
[im 12/94  soft-tissue]
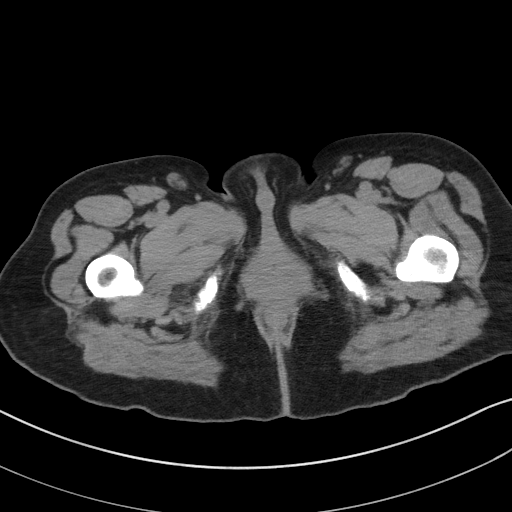
[im 19/94  soft-tissue]
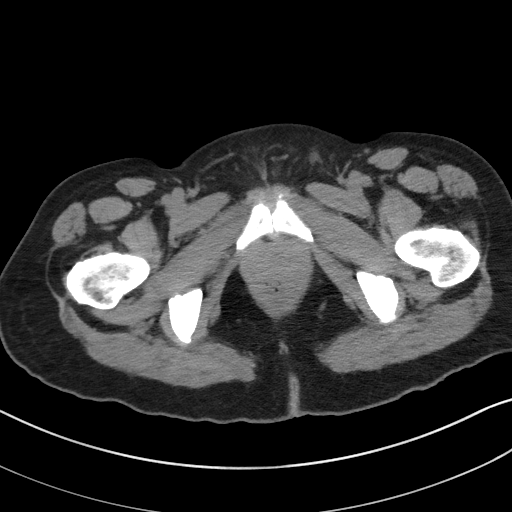
[im 27/94  soft-tissue]
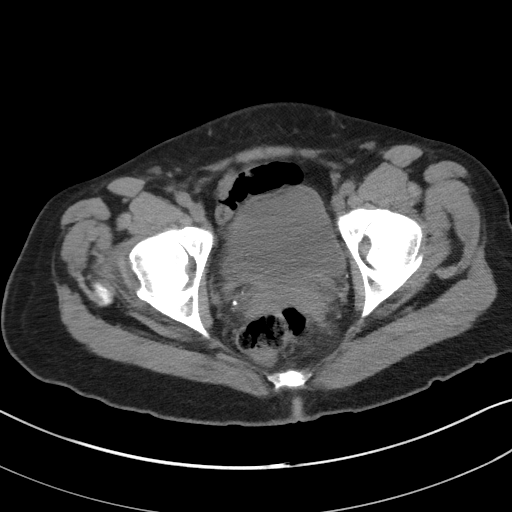
[im 34/94  soft-tissue]
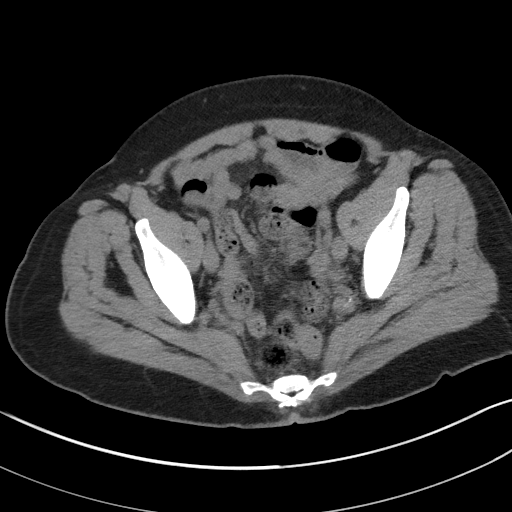
[im 41/94  soft-tissue]
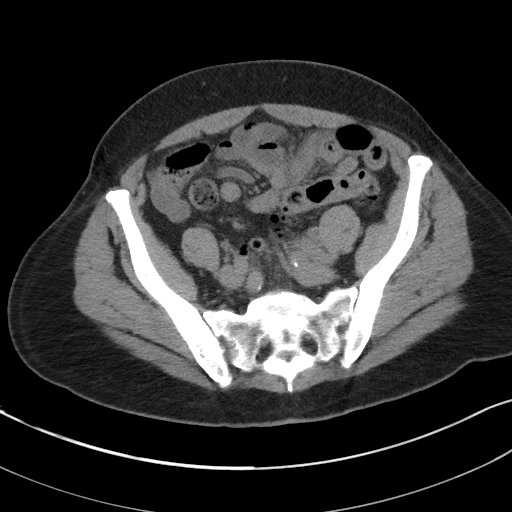
[im 49/94  soft-tissue]
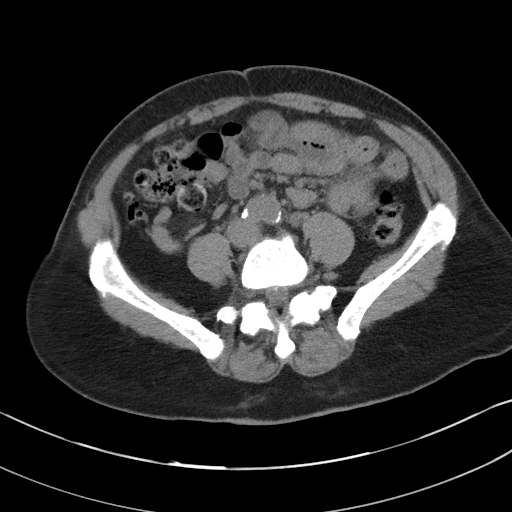
[im 53/94  soft-tissue]
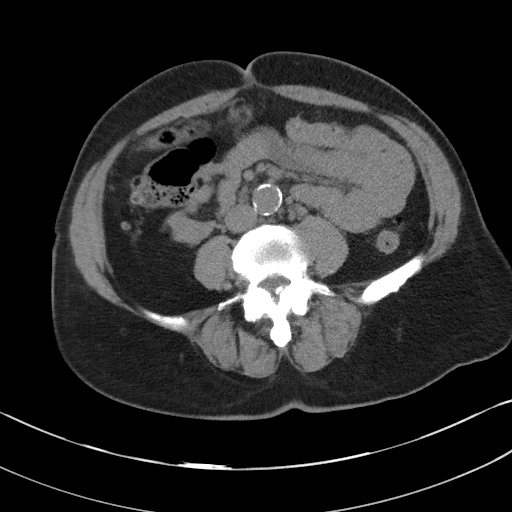
[im 60/94  soft-tissue]
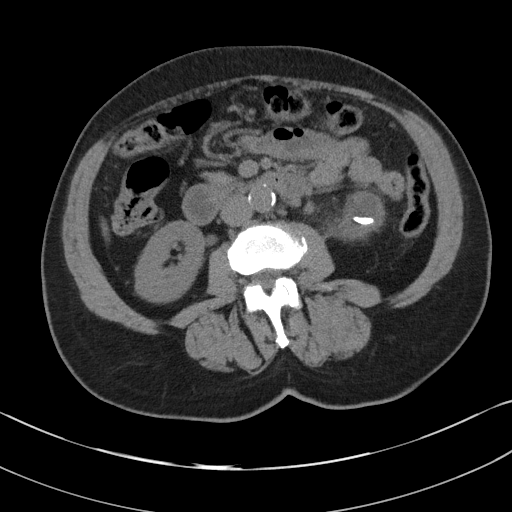
[im 60/94  bone]
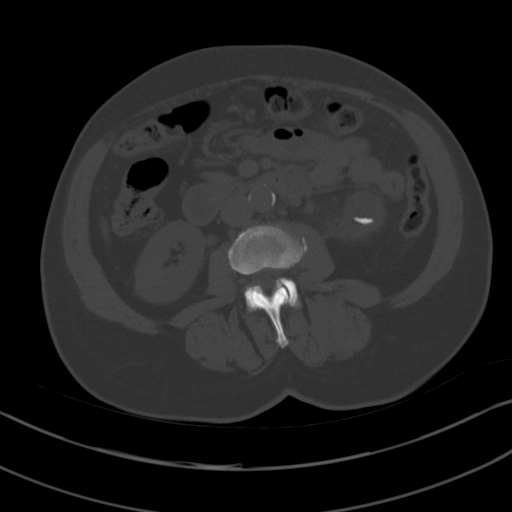
[im 67/94  soft-tissue]
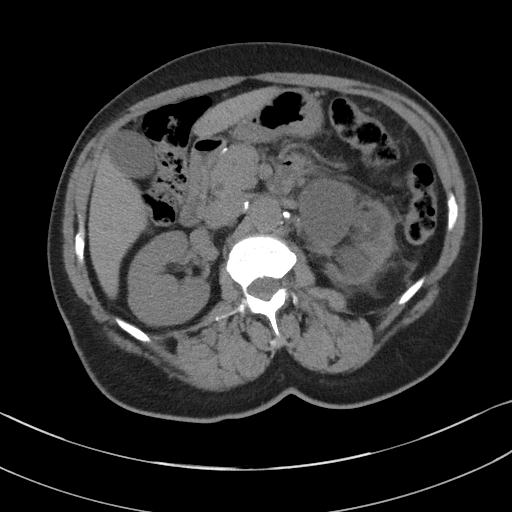
[im 75/94  soft-tissue]
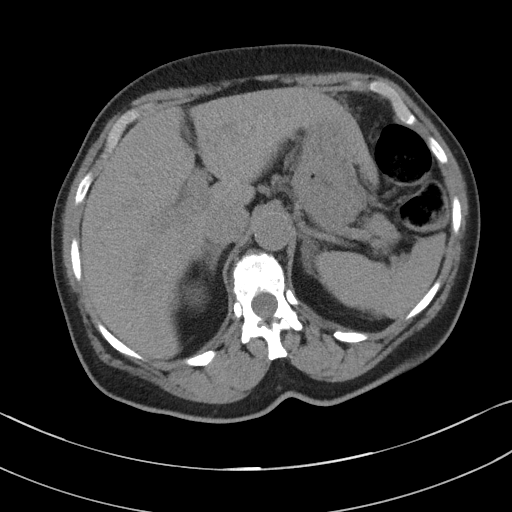
[im 82/94  soft-tissue]
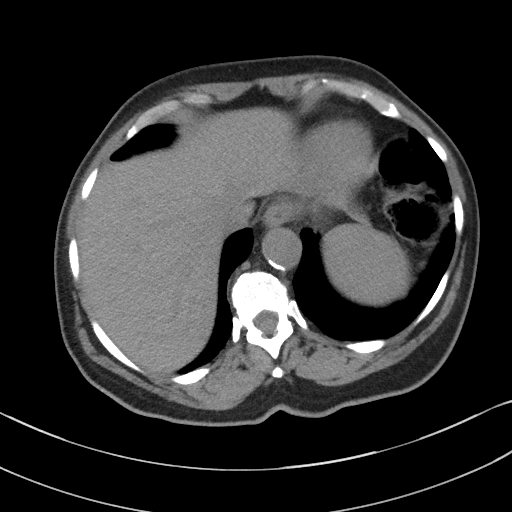
[im 90/94  soft-tissue]
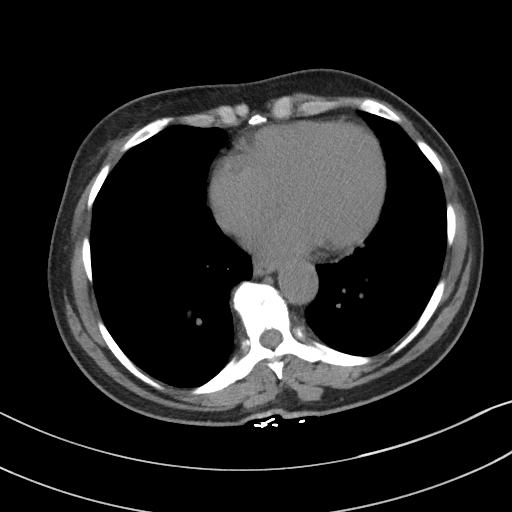

[Series 5: coronal st · coronal · 0.76mm/px · 3 of 101 slices shown]
[im 34/101  soft-tissue]
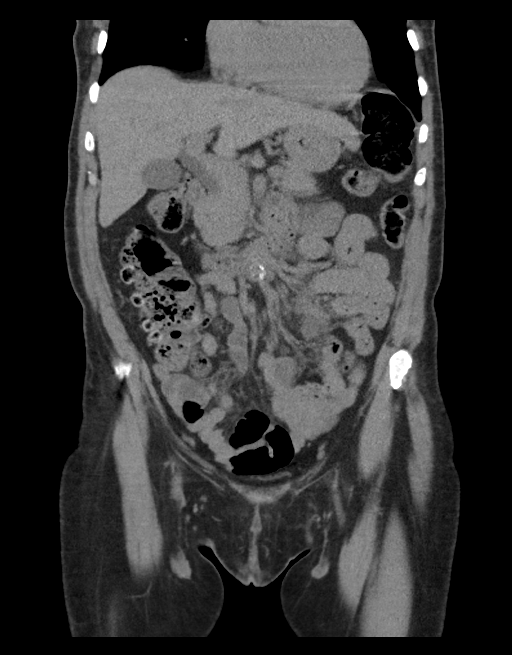
[im 45/101  soft-tissue]
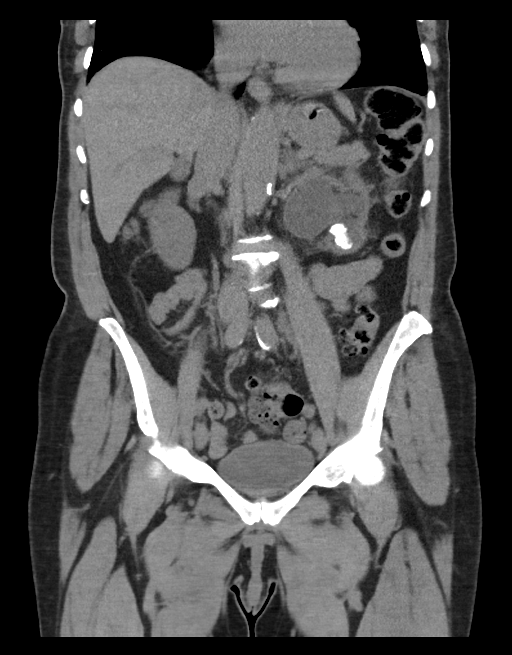
[im 56/101  soft-tissue]
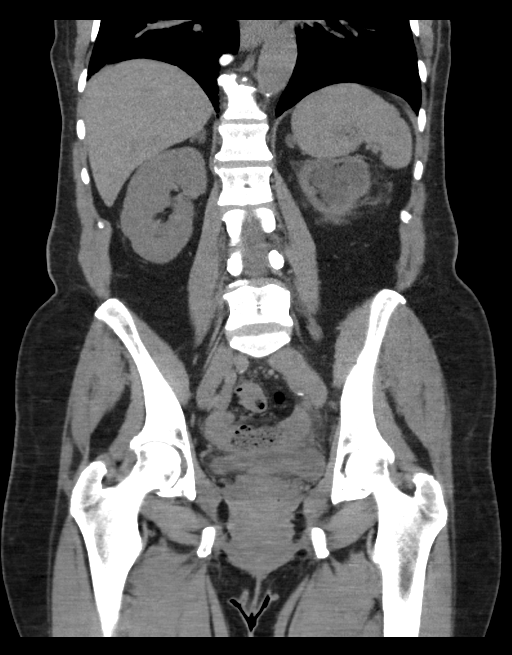

[16 of 46 positions shown; findings below may reference images not displayed]

FINDINGS: Lower chest: Descending thoracic aortic atherosclerotic
calcification.

Hepatobiliary: Unremarkable

Pancreas: Unremarkable

Spleen: Unremarkable

Adrenals/Urinary Tract: Adrenal glands unremarkable.

Notable asymmetric perirenal stranding noted with prominent
hydronephrosis, dilated collecting system, and minimal if any
hydroureter. 2.2 by 1.2 by 0.3 cm calcification in the left kidney
lower pole, with a flat morphology. No other urinary tract calculi
are observed. The right kidney appears normal. Urinary bladder
normal.

Stomach/Bowel: Sigmoid colon diverticulosis. No active
diverticulitis. Appendix normal.

Vascular/Lymphatic: Aortoiliac atherosclerotic vascular disease.

Reproductive: The adnexa are somewhat indistinct. Uterus appears to
be absent.

Other: No supplemental non-categorized findings.

Musculoskeletal: Mild lower lumbar spondylosis.
IMPRESSION: 1. Prominent left hydronephrosis due to left UPJ obstruction. There
is a large flat left kidney lower pole nonobstructive renal calculus
not contributing to the apparent obstruction. Left perirenal
stranding is present along with left renal parenchymal atrophy.
2. Other imaging findings of potential clinical significance: Aortic
Atherosclerosis ([id number]). Sigmoid colon diverticulosis.

## 2022-04-27 IMAGING — DX DG CHEST 1V PORT
1 series · 1 of 1 positions shown · non-contrast
Comparison: January 08, 2020

CLINICAL DATA: Chest pain

EXAM:
PORTABLE CHEST 1 VIEW

[chest ap]
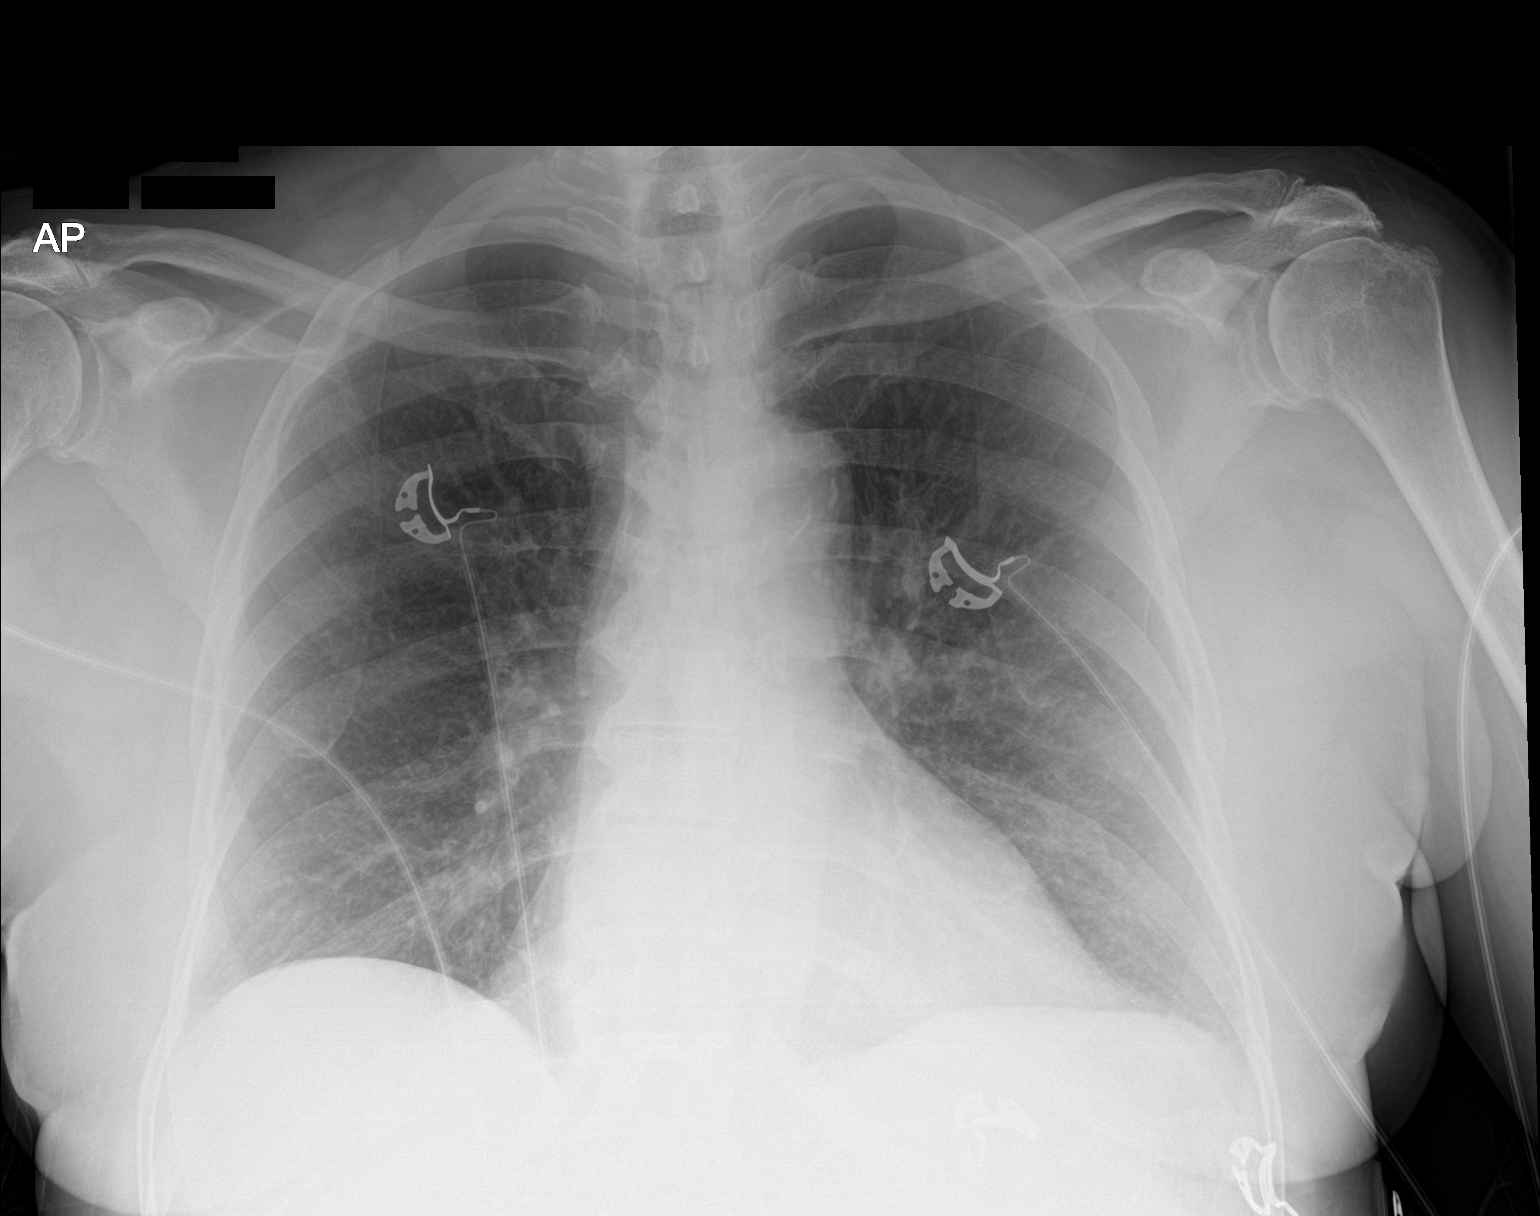

[1 of 1 positions shown; findings below may reference images not displayed]

FINDINGS: Lungs are clear. Heart is upper normal in size with pulmonary
vascularity normal. No adenopathy. There is aortic atherosclerosis.
There is degenerative change in the thoracic spine. There is also
degenerative change in each shoulder.
IMPRESSION: Lungs clear. Heart upper normal in size. Aortic Atherosclerosis
([id number]).

## 2023-10-16 ENCOUNTER — Telehealth: Payer: Self-pay

## 2023-10-16 NOTE — Patient Outreach (Signed)
 Aging Gracefully Program  10/16/2023  Kristin Ochoa 1946/05/23 [id number]   [facility] Evaluation Interviewer attempted to call patient on today regarding Aging Gracefully referral. No answer from patient after multiple rings. CMA left confidential voicemail for patient to return call.  Will attempt to call back within 1 week.    [facility]  Population Health Care Management Assistant  Direct Dial: [phone]  Fax: [phone] Website: [email or web address]

## 2023-10-16 NOTE — Patient Outreach (Signed)
 Aging Gracefully Program  10/16/2023  Kristin Ochoa 1945/11/12 [id number]   [facility] Evaluation Interviewer made contact with patient. Aging Gracefully survey completed.   Interviewer will send referral to RN and OT for follow up.    [facility]  Population Health Care Management Assistant  Direct Dial: [phone]  Fax: [phone] Website: [email or web address]

## 2023-11-04 ENCOUNTER — Other Ambulatory Visit: Payer: Self-pay | Admitting: Specialist

## 2023-11-09 NOTE — Patient Outreach (Signed)
 Aging Gracefully Program  OT Initial Visit  11/04/2023  Kristin Ochoa Dec 22, 1945 [id number]  Visit:  1- Initial Visit  Start Time:  1600 End Time:  1645 Total Minutes:  45  CCAP: Typical Daily Routine: Typical Daily Routine:: patient lives with her husband.  She drives and does a majority of the cooking and cleaning in the home. What Types Of Care Problems Are You Having Throughout The Day?: difficulty with bathroom transfers and entering and exiting home. What Kind Of Help Do You Receive?: none Do You Think You Need Other Types Of Help?: would love someone to clean her home What Do You Think Would Make Everyday Life Easier For You?: easier mobility in and out of home. What Is A Good Day Like?: minimal pain from rheumatoid arthritis What Is A Bad Day Like?: increased pain and decreased mobility Do You Have Time For Yourself?: yes Patient Reported Equipment: Patient Reported Equipment Currently Used: Sports administrator, Paediatric nurse, Raised Engineer, civil (consulting), Grab Bars Functional Mobility-Walking Indoors/Getting Around the [employer]:   Functional Mobility-Walk A Block: Walk A Block: No Difficulty Functional Mobility-Maintain Balance While Showering: Maintaining Balance While Showering: A Little Difficulty Other Comments:: uses shower seat Functional Mobility-Stooping, Crouching, Kneeling To Retreive Item: Stooping, Crouching, or Kneeling To Retrieve Item: A Lot Of Difficulty Other Comments:: uses a Nurse, adult Mobility-Bending From Standing Position To Pick Up Clothing Off The Floor: Bending Over From Standing Position To Pick Up Clothing Off The Floor: A Lot Of Difficulty Other Comments:: uses a reacher Functional Mobility-Reaching For Items Above Shoulder Level: Reaching For Items Above Shoulder Level: No Difficulty Functional Mobility-Climb 1 Flight Of Stairs: Climb 1 Flight Of Stairs: No Difficulty Functional Mobility-Move In And Out Of Chair: Move In and Out Of A Chair: No  Difficulty Functional Mobility-Move In And Out Of Bed: Move In and Out Of Bed: A Little Difficulty Do You:: Use A Device Importance Of Learning New Strategies:: Very Much Other Comments:: uses a chair as a stool to get in and out of bed Intervention: Yes Functional Mobility-Move In And Out Of Bath/Shower: Move In And Out Of A Bath/Shower: Moderate Difficulty Do You:: Use A Device Importance Of Learning New Strategies:: Very Much Intervention: Yes Functional Mobility-Get On And Off Toilet: Getting Up From The Floor: Unable To Do Functional Mobility-Into And Out Of Car, Not Including Driving: Into  And Out Of Car, Not Including Driving: No Difficulty Functional Mobility-Other Mobility Difficulty:      Activities of Daily Living-Bathing/Showering: ADL-Bathing/Showering: No Difficulty Activities of Daily Living-Personal Hygiene and Grooming: Personal Hygiene and Grooming: No Difficulty Activities of Daily Living-Toilet Hygiene: Toilet Hygiene: No Difficulty Activities of Daily Living-Put On And Take Off Undergarments (Incl. Fasteners): Put On And Take Off Undergarments (Incl. Fasteners): No Difficulty Activities of Daily Living-Put On And Take Off Shirt/Dress/Coat (Incl. Fasteners): Put On And Take Off Shirt/Dress/Coat (Incl. Fasteners): No Difficulty Activities of Daily Living-Put On And Take Off Socks And Shoes: Put On And Take Off Socks And  Shoes: Moderate Difficulty Do You:: Use Personal Assistance Importance Of Learning New Strategies: Moderate Intervention: Yes Activities of Daily Living-Feed Self: Feed Self: No Difficulty Activities of Daily Living-Rest And Sleep: Rest and Sleep: No Difficulty Activities of Daily Living-Sexual Activity: Sexual  Activity: No Difficulty Activities of Daily Living-Other Activity Identified:    Instrumental Activities of Daily Living-Light Homemaking (Laundry, Straightening Up, Vacuuming):  Do Light Homemaking (Laundry, Straightening Up,  Vacuuming): A Little Difficulty Do You:: No Device/No Assistance Instrumental Activities of Daily Living-Making A Bed:  Making a Bed: A Little Difficulty Do You:: No Device/No Assistance Instrumental Activities of Daily Living-Washing Dishes By Hand While Standing At The Sink: Washing Dishes By Hand While Standing At The Sink: No Difficulty Instrumental Activities of Daily Living-Grocery Shopping: Do Grocery Shopping: No Difficulty Instrumental Activities of Daily Living-Use Telephone: Use Telephone: No Difficulty Instrumental Activities of Daily Living-Financial Management: Financial Management: No Difficulty Instrumental Activities of Daily Living-Medications: Take Medications: No Difficulty Instrumental Activities of Daily Living-Health Management And Maintenance: Health Management & Maintenance: No Difficulty Instrumental Activities of Daily Living-Meal Preparation and Clean-Up: Meal Preparation and Clean-Up: A Little Difficulty Do You:: No Device/No Assistance Importance Of Learning New Strategies: Not At All Intervention: Yes Instrumental Activities of Daily Living-Provide Care For Others/Pets: Care For Others/Pets: No Difficulty Instrumental Activities of Daily Living-Take Part In Organized Social Activities: Take Part In Organized Social Activities: No Difficulty Instrumental Activities of Daily Living-Leisure Participation: Leisure Participation: No Difficulty Instrumental Activities of Daily Living-Employment/Volunteer Activities: Employment/Volunteer Activities: No Difficulty Instrumental Activities of Daily Living-Other Identifies:    Readiness To Change Score:  Readiness to Change Score: 8  Home Environment Assessment: Outside Home Entry:: front entrance has loose steps and no handrails.  back entrance steps are extremely steep and there is nothing to hold onto when entering home. Dining Room:: n/a Living Room:: n/a Kitchen:: electrical outlets do not work in a  portion of the kitchen.  cabinets do not close.  ceiling may leak in kitchen and dining room. Stairs:: side and back entrance steps are in need of repair and railings Bathroom:: hall bath tub is in very poor condition with crumbling wall and leaking faucet.  commode is extremely low.  masterbath tub is difficult to step in and out of, Master Bedroom:: bedroom ceiling is leaking.  bed is very high, needs a better stepstool to use to get in and out of bed. Laundry:: located on porch Basement:: n/a Hallways:: n/a Smoke/CO2 Detector:: n/a Scientist, forensic:: unknown Technical brewer:: n/a  Horticulturist, commercial:    Patient Education: Education Provided: Yes Education Details: "Check for Location manager) Educated: Patient, Spouse Comprehension: Verbalized Understanding  Goals:  Goals Addressed             This Visit's Progress    Patient Stated       Patient will improve safety and independence with bathroom transfers.     Patient Stated       Patient will improve safety and independence with cooking and mobility in kitchen.     Patient Stated       Patient will improve safety entering and exiting her bed.     Patient Stated       Improve independence donning and doffing socks and shoes.         Post Clinical Reasoning: Clinician View Of Client Situation:: Mrs. Haney is able to do most activities independently.  She makes due with what she has, however, after further conversation, it does become evident that tasks are sometimes difficult and she would enjoy and benefit from modifications and adaptive equipment. Client View Of His/Her Situation:: doing the best she can with what she has. Next Visit Plan:: Discuss how to safely get up from a fall, problem solve goal 1: improve safety and independence with donning and doffing socks and shoes.   Shirlean Mylar, MHA, OT/L [phone]

## 2023-11-11 ENCOUNTER — Other Ambulatory Visit: Payer: Self-pay | Admitting: *Deleted

## 2023-11-11 NOTE — Patient Outreach (Signed)
 Aging Gracefully Program  11/11/2023  Scarleth Zapanta Dec 17, 1945 [id number]    Attempted outreach to Mrs. Onalee Hua to schedule Aging Gracefully RN home visit. No answer. HIPAA compliant voicemail message left. Called Mr. Allbright phone and spoke with Mrs. Onalee Hua. Mrs. Akkerman reports Mr. Jacques is in the hospital having surgery. Mrs. Meeker Investment banker, corporate call them back next Monday, April 7th to schedule home visit.   Will plan to call next Monday as requested.   Raiford Noble, MSN, RN, BSN [place]  [facility], [facility] RN Case Manager for Aging Gracefully Direct Dial: [phone]

## 2023-11-14 ENCOUNTER — Other Ambulatory Visit: Payer: Self-pay | Admitting: *Deleted

## 2023-11-14 NOTE — Patient Outreach (Addendum)
 Aging Gracefully Program  11/14/2023  Kristin Ochoa 26-Apr-1946 [id number]   Telephone call received from Mrs. Kristin Ochoa, She reports [name] came home yesterday from the hospital after surgery. Discussed it will be best to give [name] some time for recovery. Therefore AG RN home visit scheduled for Wednesday, April 23rd at 10 am. Reminded Mrs. Kristin Ochoa to Higher education careers adviser if anything changes.    Raiford Noble, MSN, RN, BSN Kristin Ochoa  [facility], [facility] RN Case Manager for Aging Gracefully Direct Dial: [phone]

## 2023-12-02 ENCOUNTER — Other Ambulatory Visit: Payer: Self-pay | Admitting: Specialist

## 2023-12-03 ENCOUNTER — Encounter: Payer: Self-pay | Admitting: *Deleted

## 2023-12-03 ENCOUNTER — Other Ambulatory Visit: Payer: Self-pay | Admitting: *Deleted

## 2023-12-03 NOTE — Patient Instructions (Signed)
 Visit Information  Thank you for taking time to visit with me today. Please don't hesitate to contact me if I can be of assistance to you before our next scheduled home appointment.  Following are the goals we discussed today:  (Copy and paste patient goals from clinical care plan here)  Our next appointment is on May 27th at 10 am.   If you are experiencing a Mental Health or Behavioral Health Crisis or need someone to talk to, please call the Suicide and Crisis Lifeline: 988 call the [place]  National Suicide Prevention Lifeline: 8328569733 or TTY: 402-054-7008 TTY 6135893830) to talk to a trained counselor call 1-800-273-TALK (toll free, 24 hour hotline) go to [facility] [address], [place] [phone]) call 911    Goals Addressed               This Visit's Progress     AG RN (pt-stated)        12/03/23  Assessment: [name] Reports worsening hip pain 7 out of 10. States she plans to contact doctor for further follow up. Reports taking Tylenol  for pain.   Interventions: Discussed pain relief strategies. Encouraged to contact PCP for unrelieved pain. Provided chronic disease booklet, writer's contact information, and calendar.   Plan: Scheduled next home visit on May 27th at 10 am. Joint visit with Mr. Tortorelli.   CLIENT/RN ACTION PLAN - PAIN  Registered Nurse:  Nolberto Batty Date: 12/03/23  Client Name: Kristin Ochoa Client ID:    Target Area:  PAIN   Why Problem May Occur: Arthritis    Target Goal: Decrease in level of pain over the next 160 days   STRATEGIES Coping Strategies Ideas:  Heat  Reviewed 12/03/23   Use heating pad or warm towel on painful area no more than 20 minutes at a time. Don't sleep with a heating pad on - it could burn your skin   Ice  Reviewed 12/03/23 Use ice pack or frozen bag of vegetables on painful area.   Leave cold pack on for less than 20  minutes. Ice can burn your skin.  Don't leave ice on longer than 20 minutes.   Activity and Exercise  Reviewed 12/03/23 Joints get stiff when not in use Aging Gracefully Exercises Walking (inside or outside) [employer]:  cooking, cleaning, Building surveyor   Listen to Music Reviewed 12/03/23 Listening to music can decrease pain. Turn off the TV and turn on the radio.   Prayer/Meditation Reviewed 12/03/23 Prayer and meditation can decrease pain   Other   Acetaminophen /Tylenol  (same medication)  Reviewed 12/03/23 DO NOT take more Acetaminophen  than below because it can be bad for your liver. 500 mg. tablets:  2 tablets every 8 hours, as needed for pain.  Do not take more than 6, (500 mg) tablets every day. 300 mg. tablets:  2 tablets every 6 hours, as needed for pain.  Do not take more than 8 (325 mg) tablets every day. Look for Acetaminophen /Tylenol  in other medicines you buy over the counter.   Still in Pain  Reviewed 12/03/23 There ae a lot of different kinds of pain medicines:  creams, patches and supplements. Ask your Healthcare Provider about other pain medications.   Stop Smoking Smoking can make arthritis worse.   Stop Pain Reviewed 12/03/23 Before it gets bad. Once in pain It is harder to get rid of. Begin pain relief while you have mild pain.  Other   Other    ;  PRACTICE It is important to practice the strategies so we can determine if they will be effective in helping to reach the goal.    Follow these specific recommendations:        If strategy does not work the first time, try it again.  We may make some changes over the next few sessions.    We may make some changes over the next few sessions, based on how they work.   Nolberto Batty, MSN, RN, BSN [place]  [facility], Healthy Communities RN Case Manager for Aging Gracefully Direct Dial: [phone]                                    Nolberto Batty, MSN, RN, BSN [place]  [facility], Healthy Communities RN Case Manager for Aging Gracefully Direct Dial: [phone]

## 2023-12-03 NOTE — Patient Outreach (Signed)
 Aging Gracefully Program  RN Visit  12/03/2023  Kristin Ochoa 19-Jun-1946 [id number]  Visit:  RN Visit Number: 1- Initial Visit  RN TIME CALCULATION: Start TIme:  RN Start Time Calculation: 1000 End Time:  RN Stop Time Calculation: 1240 Total Minutes:  RN Time Calculation: 160  Readiness To Change Score:  Readiness to Change Score: 10  Universal RN Interventions: Calendar Distribution: Yes Exercise Review: No Medications: Yes Medication Changes: No Mood: Yes Pain: Yes PCP Advocacy/Support: No Fall Prevention: Yes Incontinence: Yes Clinician View Of Client Situation: Arrived for joint home visit for both Kristin Ochoa and husband. Kristin Ochoa ambulates independently without assistive device. Home neat and not cluttered. Client View Of His/Her Situation: Kristin Ochoa reports right hip pain 7 out of 10. States she usually takes Tylenol  for relief.  Healthcare Provider Communication: Did Surveyor, mining With [employer] Provider?: No Healthcare Provider Response According to RN: N/A According to Client, Did PCP Report Communication With An Aging Gracefully RN?: No Healthcare Provider Response According To Client: N/A  Clinician View of Client Situation: Clinician View Of Client Situation: Arrived for joint home visit for both Kristin Ochoa and husband. Kristin Ochoa ambulates independently without assistive device. Home neat and not cluttered. Client's View of His/Her Situation: Client View Of His/Her Situation: Kristin Ochoa reports right hip pain 7 out of 10. States she usually takes Tylenol  for relief.  Medication Assessment: Do You Have Any Problems Paying For Medications?: No Where Does Client Store Medications?: Other: (on top of the cabinet in the dining room) Can Client Read Pill Bottles?: No Does Client Use A Pillbox?: Yes Does Anyone Assist Client In Filling Pillbox?: No Does Anyone Assist Client In Taking Medications?: No Do You Take Vitamin D?: Yes Does Client Have Any  Questions Or Concerns About Medictions?: No Is Client Complaining Of Any Symptoms That Could Be Side Effects To Medications?: No Any Possible Changes In Medication Regimen?: No  OT Update: Pending [facility] assessments and contracts.  Session Summary: Kristin Ochoa is pleasant. Denies any further concerns or complaints.    Goals Addressed               This Visit's Progress     AG RN (pt-stated)        12/03/23  Assessment: Kristin Ochoa reports having chronic pain from arthritis. Reports worsening hip pain 7 out of 10. States she plans to contact doctor for further follow up. Reports taking Tylenol  for pain.   Interventions: Discussed pain relief strategies. Encouraged to contact PCP for unrelieved pain. Provided chronic disease booklet, writer's contact information, and calendar.   Plan: Scheduled next home visit on May 27th at 10 am. Joint visit with Mr. Borchard.   CLIENT/RN ACTION PLAN - PAIN  Registered Nurse:  Nolberto Batty Date: 12/03/23  Client Name: Kristin Ochoa Client ID:    Target Area:  PAIN   Why Problem May Occur: Arthritis    Target Goal: Decrease in level of pain over the next 160 days   STRATEGIES Coping Strategies Ideas:  Heat  Reviewed 12/03/23   Use heating pad or warm towel on painful area no more than 20 minutes at a time. Don't sleep with a heating pad on - it could burn your skin   Ice  Reviewed 12/03/23 Use ice pack or frozen bag of vegetables on painful area.   Leave cold pack on for less than 20 minutes. Ice can burn your skin.  Don't leave ice on longer than 20 minutes.  Activity and Exercise  Reviewed 12/03/23 Joints get stiff when not in use Aging Gracefully Exercises Walking (inside or outside) [employer]:  cooking, cleaning, Building surveyor   Listen to Music Reviewed 12/03/23 Listening to music can decrease pain. Turn off the TV and turn on the radio.   Prayer/Meditation Reviewed 12/03/23 Prayer and meditation can decrease  pain   Other   Acetaminophen /Tylenol  (same medication)  Reviewed 12/03/23 DO NOT take more Acetaminophen  than below because it can be bad for your liver. 500 mg. tablets:  2 tablets every 8 hours, as needed for pain.  Do not take more than 6, (500 mg) tablets every day. 300 mg. tablets:  2 tablets every 6 hours, as needed for pain.  Do not take more than 8 (325 mg) tablets every day. Look for Acetaminophen /Tylenol  in other medicines you buy over the counter.   Still in Pain  Reviewed 12/03/23 There ae a lot of different kinds of pain medicines:  creams, patches and supplements. Ask your Healthcare Provider about other pain medications.   Stop Smoking Smoking can make arthritis worse.   Stop Pain Reviewed 12/03/23 Before it gets bad. Once in pain It is harder to get rid of. Begin pain relief while you have mild pain.   Other   Other    ;  PRACTICE It is important to practice the strategies so we can determine if they will be effective in helping to reach the goal.    Follow these specific recommendations:        If strategy does not work the first time, try it again.  We may make some changes over the next few sessions.    We may make some changes over the next few sessions, based on how they work.   Nolberto Batty, MSN, RN, BSN [place]  [facility], Healthy Communities RN Case Manager for Aging Gracefully Direct Dial: [phone]                                   Nolberto Batty, MSN, RN, BSN [place]  [facility], Healthy Communities RN Case Manager for Aging Gracefully Direct Dial: [phone]

## 2023-12-15 ENCOUNTER — Encounter: Payer: Self-pay | Admitting: Specialist

## 2024-01-01 ENCOUNTER — Other Ambulatory Visit: Payer: Self-pay | Admitting: Specialist

## 2024-01-05 NOTE — Patient Outreach (Signed)
 Aging Gracefully Program  OT Follow-Up Visit  01/01/2024  Kristin Ochoa 11/28/1945 [id number]  Visit:  3- Third Visit  Start Time:  1500 End Time:  1535 Total Minutes:  35   Durable Medical Equipment: Adaptive Equipment: Stepstool Adaptive Equipment Distribution Date: 01/01/24  Patient Education: Education Provided: Yes Education Details: educated on how t safely use stepstool Person(s) Educated: Patient, Spouse Comprehension: Verbalized Understanding  Goals:   Goals Addressed             This Visit's Progress    COMPLETED: Patient Stated   On track    Patient will improve safety entering and exiting her bed.  Name _________________________________             Date__________________  OT ACTION PLAN: Functional Mobility  Target Problem Area:   Decreased safety getting in/out bed  Why Problem May Occur:     Bed is high  Left leg doesn't move as well  Step stool (kids chair) is not very sturdy        Target Goal(s):   Increase safety with bed transfers     STRATEGIES   Saving Your Energy DO:  Take breaks  Raise the height of surfaces   Remove tripping hazards  Use sturdy step stool with hand rail.     (Other):   (Other):     Modifying your home environment and making it safe    DO:  Install grab bars in the bathroom  Remove or strongly secure throw rugs  Use step stool with hand rail and larger platform for easier mobility   (Other):   (Other):     Simplifying the way you set up tasks or daily routines DO:  Move slowly   (Other):   (Other):   (Other):   (Other):      PRACTICE  Based on what we have talked about, you are willing to try:  _____utilize new step stool___________________________________________________________  ________________________________________________________________  If a strategy does not work the first time, try it again (and again).  We may make some changes over the next few sessions,  based on how they work.    ________________________________________________   __________ Occupational Therapist          Date            Post Clinical Reasoning: Client Action (Goal) Two Interventions: Patient will improve safety entering and exiting bed - patient was using an old kids chair to step up on and get in/out of bed.  OT introduced a 12" step stool with a hand rail.   Paitient able to hold handle and safely step up and down, wider platform allows for turning to left after stepping up to sit on bed. Did Client Try?: Yes Targeted Problem Area Status: A Lot Better Clinician View Of Client Situation:: [name] is continuing to use her LE adaptive equipment daily and loves it!  She has significant improvement in safety with bed transfer using new stepstool. Client View Of His/Her Situation:: loves her sock aid and dressing stick - states she uses it daily and her daughter would like one!  She feels much safer and secure with transferring in/out of bed with new stepstool. Next Visit Plan:: Follow up on use of step stool for getting in/out of bed. Problem solve goal 3 - safety in bathroom.  Orren Blades, MHA, OT/L [phone]

## 2024-01-06 ENCOUNTER — Encounter: Payer: Self-pay | Admitting: *Deleted

## 2024-01-06 ENCOUNTER — Other Ambulatory Visit: Payer: Self-pay | Admitting: *Deleted

## 2024-01-06 NOTE — Patient Instructions (Signed)
 Visit Information  Thank you for taking time to visit with me today. Please don't hesitate to contact me if I can be of assistance to you before our next scheduled home appointment.  Following are the goals we discussed today:   Goals Addressed               This Visit's Progress     AG RN (pt-stated)        12/03/23  Assessment: Kristin Ochoa reports having chronic pain from arthritis. Reports worsening hip pain 7 out of 10. States she plans to contact doctor for further follow up. Reports taking Tylenol  for pain.   Interventions: Discussed pain relief strategies. Encouraged to contact PCP for unrelieved pain. Provided chronic disease booklet, writer's contact information, and calendar.   Plan: Scheduled next home visit on May 27th at 10 am. Joint visit with Mr. Eakes.   CLIENT/RN ACTION PLAN - PAIN  Registered Nurse:  Nolberto Batty Date: 12/03/23  Client Name: Kristin Ochoa Client ID:    Target Area:  PAIN   Why Problem May Occur: Arthritis    Target Goal: Decrease in level of pain over the next 160 days   STRATEGIES Coping Strategies Ideas:  Heat  Reviewed 12/03/23 Reviewed 01/06/24   Use heating pad or warm towel on painful area no more than 20 minutes at a time. Don't sleep with a heating pad on - it could burn your skin   Ice  Reviewed 12/03/23 Reviewed 01/06/24 Use ice pack or frozen bag of vegetables on painful area.   Leave cold pack on for less than 20 minutes. Ice can burn your skin.  Don't leave ice on longer than 20 minutes.   Activity and Exercise  Reviewed 12/03/23 Reviewed 01/06/24 Joints get stiff when not in use Aging Gracefully Exercises Walking (inside or outside) [employer]:  cooking, cleaning, Building surveyor   Listen to Music Reviewed 12/03/23 Reviewed 01/06/24 Listening to music can decrease pain. Turn off the TV and turn on the radio.   Prayer/Meditation Reviewed 12/03/23 Reviewed 01/06/24 Prayer and meditation can decrease  pain   Other   Acetaminophen /Tylenol  (same medication)  Reviewed 12/03/23 Reviewed 01/06/24 DO NOT take more Acetaminophen  than below because it can be bad for your liver. 500 mg. tablets:  2 tablets every 8 hours, as needed for pain.  Do not take more than 6, (500 mg) tablets every day. 300 mg. tablets:  2 tablets every 6 hours, as needed for pain.  Do not take more than 8 (325 mg) tablets every day. Look for Acetaminophen /Tylenol  in other medicines you buy over the counter.   Still in Pain  Reviewed 12/03/23 Reviewed 01/06/24 There ae a lot of different kinds of pain medicines:  creams, patches and supplements. Ask your Healthcare Provider about other pain medications.   Stop Smoking Smoking can make arthritis worse.   Stop Pain Reviewed 12/03/23 Reviewed 01/06/24 Before it gets bad. Once in pain It is harder to get rid of. Begin pain relief while you have mild pain.   Other   Other    ;  PRACTICE It is important to practice the strategies so we can determine if they will be effective in helping to reach the goal.    Follow these specific recommendations:        If strategy does not work the first time, try it again.  We may make some changes over the next few sessions.    We may make some  changes over the next few sessions, based on how they work.   Nolberto Batty, MSN, RN, BSN [facility], Healthy Communities RN Case Manager for Aging Gracefully Direct Dial: [phone]     01/06/24  Assessment: Kristin Ochoa reports taking Tylenol  for pain with relief. Reports having a lot going on lately. States grandson recently passed and funeral is this upcoming Saturday. Reports having doctor appointment scheduled tomorrow for hip evaluation. Reports chronic hip pain.   Interventions: Performed Aging Gracefully exercises and Kristin Ochoa partially performed return demonstration. Provided home exercise booklet. Encouraged Mrs Man to do exercises as able  without injuring herself or causing more pain. Offered condolences for her grandson.   Plan: Scheduled next home visit for June 30th at 10 am.                                 Our next appointment is on June 30 at 10 am.  If you are experiencing a Mental Health or Behavioral Health Crisis or need someone to talk to, please call the Suicide and Crisis Lifeline: 988 call the [place]  National Suicide Prevention Lifeline: 636 058 3859 or TTY: 409-721-9457 TTY 604-484-0316) to talk to a trained counselor call 1-800-273-TALK (toll free, 24 hour hotline) go to [facility] [address] [place] [phone]) call 911   The patient verbalized understanding of instructions, educational materials, and care plan provided today and agreed to receive a mailed copy of patient instructions, educational materials, and care plan.   Nolberto Batty, MSN, RN, BSN [place]  [facility], Healthy Communities RN Case Manager for Aging Gracefully Direct Dial: [phone]

## 2024-01-06 NOTE — Patient Outreach (Signed)
 Aging Gracefully Program  RN Visit  01/06/2024  Kristin Ochoa 16-Jul-1946 [id number]  Visit:  RN Visit Number: 2- Second Visit  RN TIME CALCULATION: Start TIme:  RN Start Time Calculation: 1000 End Time:  RN Stop Time Calculation: 1040 Total Minutes:  RN Time Calculation: 40  Readiness To Change Score:  Readiness to Change Score: 10  Universal RN Interventions: Calendar Distribution: No Exercise Review: Yes Medications: Yes Medication Changes: No Mood: Yes Pain: Yes PCP Advocacy/Support: No Fall Prevention: Yes Incontinence: Yes Clinician View Of Client Situation: Arrived for joint home visit for Mr. and Mrs. Myrtie Atkinson. [name] answered door. Ambulates independently without assistive device. Client View Of His/Her Situation: [name] reports pain 0 out of 10. States she has appointment scheduled for tomorrow about her hip.  Healthcare Provider Communication: Did Surveyor, mining With [employer] Provider?: No Healthcare Provider Response According to RN: n/a According to Client, Did PCP Report Communication With An Aging Gracefully RN?: No Healthcare Provider Response According To Client: n/a  Clinician View of Client Situation: Clinician View Of Client Situation: Arrived for joint home visit for Mr. and Mrs. Myrtie Atkinson. [name] answered door. Ambulates independently without assistive device. Client's View of His/Her Situation: Client View Of His/Her Situation: [name] reports pain 0 out of 10. States she has appointment scheduled for tomorrow about her hip.  Medication Assessment: Reviewed. Reports no changes.     OT Update: Pending [facility]S assessments and contracts.   Session Summary: [name] doing well overall. Denies pain during visit.   Goals Addressed               This Visit's Progress     AG RN (pt-stated)        12/03/23  Assessment: [name] reports having chronic pain from arthritis. Reports worsening hip pain 7 out of 10. States she plans to  contact doctor for further follow up. Reports taking Tylenol  for pain.   Interventions: Discussed pain relief strategies. Encouraged to contact PCP for unrelieved pain. Provided chronic disease booklet, writer's contact information, and calendar.   Plan: Scheduled next home visit on May 27th at 10 am. Joint visit with Mr. Johannsen.   CLIENT/RN ACTION PLAN - PAIN  Registered Nurse:  Nolberto Batty Date: 12/03/23  Client Name: Kristin Ochoa Client ID:    Target Area:  PAIN   Why Problem May Occur: Arthritis    Target Goal: Decrease in level of pain over the next 160 days   STRATEGIES Coping Strategies Ideas:  Heat  Reviewed 12/03/23 Reviewed 01/06/24   Use heating pad or warm towel on painful area no more than 20 minutes at a time. Don't sleep with a heating pad on - it could burn your skin   Ice  Reviewed 12/03/23 Reviewed 01/06/24 Use ice pack or frozen bag of vegetables on painful area.   Leave cold pack on for less than 20 minutes. Ice can burn your skin.  Don't leave ice on longer than 20 minutes.   Activity and Exercise  Reviewed 12/03/23 Reviewed 01/06/24 Joints get stiff when not in use Aging Gracefully Exercises Walking (inside or outside) [employer]:  cooking, cleaning, Building surveyor   Listen to Music Reviewed 12/03/23 Reviewed 01/06/24 Listening to music can decrease pain. Turn off the TV and turn on the radio.   Prayer/Meditation Reviewed 12/03/23 Reviewed 01/06/24 Prayer and meditation can decrease pain   Other   Acetaminophen /Tylenol  (same medication)  Reviewed 12/03/23 Reviewed 01/06/24 DO NOT take more  Acetaminophen  than below because it can be bad for your liver. 500 mg. tablets:  2 tablets every 8 hours, as needed for pain.  Do not take more than 6, (500 mg) tablets every day. 300 mg. tablets:  2 tablets every 6 hours, as needed for pain.  Do not take more than 8 (325 mg) tablets every day. Look for Acetaminophen /Tylenol  in other medicines  you buy over the counter.   Still in Pain  Reviewed 12/03/23 Reviewed 01/06/24 There ae a lot of different kinds of pain medicines:  creams, patches and supplements. Ask your Healthcare Provider about other pain medications.   Stop Smoking Smoking can make arthritis worse.   Stop Pain Reviewed 12/03/23 Reviewed 01/06/24 Before it gets bad. Once in pain It is harder to get rid of. Begin pain relief while you have mild pain.   Other   Other    ;  PRACTICE It is important to practice the strategies so we can determine if they will be effective in helping to reach the goal.    Follow these specific recommendations:        If strategy does not work the first time, try it again.  We may make some changes over the next few sessions.    We may make some changes over the next few sessions, based on how they work.   Nolberto Batty, MSN, RN, BSN [facility], Healthy Communities RN Case Manager for Aging Gracefully Direct Dial: [phone]     01/06/24  Assessment: [name] reports taking Tylenol  for pain with relief. Reports having a lot going on lately. States grandson recently passed and funeral is this upcoming Saturday. Reports having doctor appointment scheduled tomorrow for hip evaluation. Reports chronic hip pain.   Interventions: Performed Aging Gracefully exercises and [name] partially performed return demonstration. Provided home exercise booklet. Encouraged [name] to do exercises as able without injuring herself or causing more pain. Offered condolences for her grandson.   Plan: Scheduled next home visit for June 30th at 10 am.

## 2024-02-06 ENCOUNTER — Other Ambulatory Visit: Payer: Self-pay | Admitting: *Deleted

## 2024-02-06 NOTE — Patient Outreach (Signed)
 Aging Gracefully Program  02/06/2024  Kristin Ochoa 05/07/1946 [id number]   Telephone call made to [name] to confirm Aging Gracefully RN joint home visit appointment on Monday June 30th. [name] reports her husband [name] is in the hospital currently. Unsure of when he will return home. Discussed cancelling upcoming AG RN home visit.   Plan: Writer will call back next week to discuss appropriate time to reschedule and to check on [name] status.     Pablo Hurst, MSN, RN, BSN [place]  [facility], Healthy Communities RN Case Manager for Aging Gracefully Direct Dial: [phone]

## 2024-02-11 ENCOUNTER — Other Ambulatory Visit: Payer: Self-pay | Admitting: *Deleted

## 2024-02-11 NOTE — Patient Outreach (Addendum)
 Aging Gracefully Program  02/11/2024  Kristin Ochoa 05/06/1946 [id number]   Telephone call made to [name] to follow up on [name] status. [name] reports [name] remains in hospital and likely to discharge home on today or tomorrow. Discussed writer will call back next week to discuss rescheduling AG RN home visit.    Pablo Hurst, MSN, RN, BSN [place]  [facility], Healthy Communities RN Case Manager for Aging Gracefully Direct Dial: [phone]

## 2024-02-20 ENCOUNTER — Other Ambulatory Visit: Payer: Self-pay | Admitting: *Deleted

## 2024-02-20 NOTE — Patient Outreach (Signed)
 Aging Gracefully Program  02/20/2024  Felipa Waren 11-Feb-1946 [id number]   Telephone call to Mrs. Alm to schedule AG RN home visit. Mrs. Stainback reports Mr. Sundquist came home from the hospital on this past Saturday. States they have to leave their home for home repairs. Mrs. Going Investment banker, corporate to call back next week to schedule. States they will relocate temporarily in [place].  Writer will call back next week.  Pablo Hurst, MSN, RN, BSN [place]  [facility], Healthy Communities RN Case Manager for Aging Gracefully Direct Dial: [phone]

## 2024-03-02 ENCOUNTER — Other Ambulatory Visit: Payer: Self-pay | Admitting: *Deleted

## 2024-03-02 NOTE — Patient Outreach (Signed)
 Aging Gracefully Program  03/02/2024  Shakinah Bogdanski 03-Mar-1946 [id number]  Telephone call made to Mrs. Alm to schedule joint AG RN home visit for Mr and Mrs. Alm.  Mrs. Satchell endorses they are still staying in another place until their home renovations are completed.   Mrs. Montufar is agreeable to AG RN home visit on Monday, July 28th at 12noon.  Temporary address is [address] [place], [place].    Pablo Hurst, MSN, RN, BSN [place]  [facility], Healthy Communities RN Case Manager for Aging Gracefully Direct Dial: [phone]

## 2024-03-08 ENCOUNTER — Other Ambulatory Visit: Payer: Self-pay | Admitting: *Deleted

## 2024-03-08 ENCOUNTER — Encounter: Payer: Self-pay | Admitting: *Deleted
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
## 2024-04-01 ENCOUNTER — Encounter: Payer: Self-pay | Admitting: Specialist

## 2024-04-02 ENCOUNTER — Other Ambulatory Visit: Payer: Self-pay | Admitting: Specialist

## 2024-04-04 NOTE — Patient Outreach (Signed)
 Aging Gracefully Program  OT Follow-Up Visit  04/04/2024  Kristin Ochoa Jul 04, 1946 [id number]  Visit:  4- Fourth Visit  Start Time:  1700 End Time:  1740 Total Minutes:  40  Readiness to Change Score :  Readiness to Change Score: 9   Kristin Ochoa Education: Education Provided: Yes Education Details: reviewed how to utilize tips for aging in place book Person(s) Educated: Kristin Ochoa, Spouse Comprehension: Verbalized Understanding  Goals:   Goals Addressed             This Visit's Progress    Kristin Ochoa Stated   On track    Kristin Ochoa will improve safety and independence with bathroom transfers.  Name _________________________________             Date__________________  OT ACTION PLAN: Functional Mobility  Target Problem Area:   Difficulty with transfers in bathroom  Why Problem May Occur:     Decreased balance and leg strength  Tub wall is difficult to step over into tub.  Commode is low        Target Goal(s):   Improved safety and independence with transfers in bathroom.    STRATEGIES   Saving Your Energy DO:  Take breaks  Raise the height of surfaces   Remove tripping hazards  (Other):   (Other):   (Other):     Modifying your home environment and making it safe    DO:  X Install grab bars in the bathroom  Remove or strongly secure throw rugs  X install comfort level commode   X remove tub and replace with walk in shower   (Other):     Simplifying the way you set up tasks or daily routines DO:  Move slowly   (Other):   (Other):   (Other):   (Other):      PRACTICE  Based on what we have talked about, you are willing to try:  ________________________________________________________________  ________________________________________________________________  If a strategy does not work the first time, try it again (and again).  We may make some changes over the next few sessions, based on how they work.    ________Beth Jason,  OT/L________________________________________   ___8/24/25_______ Occupational Therapist          Date         Kristin Ochoa Stated   On track    Kristin Ochoa will improve safety and independence with cooking and mobility in kitchen.     Kristin Ochoa Stated   On track    Improve independence donning and doffing socks and shoes.   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    STRATEGIES   Saving Your Energy DO:  Sit down to do tasks- a lightweight chair can be kept in the bathroom  X:  Use appropriate adaptive equipment (circle appropriate equipment): long-handled shoe horn, sock aide, dressing stick, reacher   Keep all items you'll need within easy reach  (Other):   (Other):   (Other):     Modifying your home environment and making it safe    DO:  Simplifying the way you set up tasks or daily routines DO:  Wear sturdy shoes that grip the floor   Gather everything you will need before beginning  Wear clothing that is easily manipulated (elastic waistband, etc.)  (Other)   (Other):   (Other):     PRACTICE  Based on what we have talked about, you are willing to try:  ____Practice with use of sock aid and dressing stick.__________________  ________________________________________________________________   If an idea does not work the first time, try it again (and again).  We may make some changes over the next few sessions, based on how they work.    Landry Elbe, MHA, OT/L         ____________________________________________    __4/22/25_________ Occupational Therapist           Date          Post Clinical Reasoning: Kristin Ochoa Action (Goal) Three Interventions: Kristin Ochoa will improve safety with entering and exiting shower and on and off commode. Did Kristin Ochoa Try?: Yes Targeted Problem Area Status: A Lot Better Kristin Ochoa Action (Goal) Four Interventions: Kristin Ochoa will improve safety entering and exiting home and backyard Did Kristin Ochoa Try?: Yes Targeted Problem Area Status: A Lot Better Clinician View Of Kristin Ochoa Situation:: Kristin Ochoa is continuing to use her LE adaptive equipment daily.  She is using her stepstool for bed transfers, as well. Kristin Ochoa is now able to complete transfers in bathroom with increased safety and independence as well as transfer in and out of home with hand rails with significant increase in safety. Kristin Ochoa View Of His/Her Situation:: Kristin Ochoa states she is doing well.  She is feeling much safer in the bathroom and moving about her home.  She is continuing to provide assistance to her husband and is very thankful for the work that [facility] completed. Next Visit Plan:: Mail Tips for Aging in Place booklet to Kristin Ochoa and discharge from [employer] as all goals met.  Heather HILARIO Elbe, MHA, OT/L [phone]

## 2024-04-05 ENCOUNTER — Other Ambulatory Visit: Payer: Self-pay | Admitting: *Deleted

## 2024-04-05 ENCOUNTER — Encounter: Payer: Self-pay | Admitting: *Deleted

## 2024-04-05 NOTE — Patient Outreach (Signed)
 Aging Gracefully Program  RN Visit  04/05/2024  Kristin Ochoa Dec 19, 1945 [id number]  Visit:  RN Visit Number: 4- Fourth Visit  RN TIME CALCULATION: Start TIme:  RN Start Time Calculation: 0930 End Time:  RN Stop Time Calculation: 1020 Total Minutes:  RN Time Calculation: 50  Readiness To Change Score:  Readiness to Change Score: 10  Universal RN Interventions: Calendar Distribution: No Exercise Review: Yes Medications: Yes Medication Changes: No Mood: Yes Pain: Yes PCP Advocacy/Support: No Fall Prevention: Yes Incontinence: Yes Clinician View Of Client Situation: Arrived for home joint visit for Kristin Ochoa and spouse. Front room area neat without clutter. Client View Of His/Her Situation: Kristin Ochoa denies pain. Pleased with [facility] home modifications and repairs,  Healthcare Provider Communication: Did Surveyor, mining With [employer] Provider?: No Healthcare Provider Response According to RN: n/a According to Client, Did PCP Report Communication With An Aging Gracefully RN?: No Healthcare Provider Response According To Client: n/a  Clinician View of Client Situation: Clinician View Of Client Situation: Arrived for home joint visit for Kristin Ochoa and spouse. Front room area neat without clutter. Client's View of His/Her Situation: Client View Of His/Her Situation: Kristin Ochoa denies pain. Pleased with [facility] home modifications and repairs,  Medication Assessment: reviewed    OT Update: completed  Session Summary: Kristin Ochoa is doing well overall. Reports pain 0 out of 10. She is pleased with [facility] home modifications and repairs.   Goals Addressed               This Visit's Progress     COMPLETED: AG RN (pt-stated)        12/03/23  Assessment: Kristin Ochoa reports having chronic pain from arthritis. Reports worsening hip pain 7 out of 10. States she plans to contact doctor for further follow up. Reports taking Tylenol  for pain.   Interventions: Discussed pain relief  strategies. Encouraged to contact PCP for unrelieved pain. Provided chronic disease booklet, writer's contact information, and calendar.   Plan: Scheduled next home visit on May 27th at 10 am. Joint visit with [name].   CLIENT/RN ACTION PLAN - PAIN  Registered Nurse:  Pablo Hurst Date: 12/03/23  Client Name: Kristin Ochoa Client ID:    Target Area:  PAIN   Why Problem May Occur: Arthritis    Target Goal: Decrease in level of pain over the next 160 days   STRATEGIES Coping Strategies Ideas:  Heat  Reviewed 12/03/23 Reviewed 01/06/24 Reviewed 03/08/24 Reviewed 04/05/24   Use heating pad or warm towel on painful area no more than 20 minutes at a time. Don't sleep with a heating pad on - it could burn your skin   Ice  Reviewed 12/03/23 Reviewed 01/06/24 Reviewed 03/08/24 Reviewed 04/05/24 Use ice pack or frozen bag of vegetables on painful area.   Leave cold pack on for less than 20 minutes. Ice can burn your skin.  Don't leave ice on longer than 20 minutes.   Activity and Exercise  Reviewed 12/03/23 Reviewed 01/06/24 Reviewed 03/08/24 Reviewed 04/05/24 Joints get stiff when not in use Aging Gracefully Exercises Walking (inside or outside) [employer]:  cooking, cleaning, Building surveyor   Listen to Music Reviewed 12/03/23 Reviewed 01/06/24 Reviewed 03/08/24 Reviewed 04/05/24 Listening to music can decrease pain. Turn off the TV and turn on the radio.   Prayer/Meditation Reviewed 12/03/23 Reviewed 01/06/24 Reviewed 03/08/24 Reviewed 04/05/24 Prayer and meditation can decrease pain   Other   Acetaminophen /Tylenol  (same medication)  Reviewed 12/03/23 Reviewed 01/06/24  Reviewed 03/08/24 Reviewed 04/05/24 DO NOT take more Acetaminophen  than below because it can be bad for your liver. 500 mg. tablets:  2 tablets every 8 hours, as needed for pain.  Do not take more than 6, (500 mg) tablets every day. 300 mg. tablets:  2 tablets every 6 hours, as needed for pain.   Do not take more than 8 (325 mg) tablets every day. Look for Acetaminophen /Tylenol  in other medicines you buy over the counter.   Still in Pain  Reviewed 12/03/23 Reviewed 01/06/24 Reviewed 03/08/24 Reviewed 04/05/24 There ae a lot of different kinds of pain medicines:  creams, patches and supplements. Ask your Healthcare Provider about other pain medications.   Stop Smoking Smoking can make arthritis worse.   Stop Pain Reviewed 12/03/23 Reviewed 01/06/24 Reviewed 03/08/24 Reviewed 04/05/24 Before it gets bad. Once in pain It is harder to get rid of. Begin pain relief while you have mild pain.   Other   Other    ;  PRACTICE It is important to practice the strategies so we can determine if they will be effective in helping to reach the goal.    Follow these specific recommendations:        If strategy does not work the first time, try it again.  We may make some changes over the next few sessions.    We may make some changes over the next few sessions, based on how they work.   Pablo Hurst, MSN, RN, BSN [facility], Healthy Communities RN Case Manager for Aging Gracefully Direct Dial: [phone]     01/06/24  Assessment: Kristin Ochoa reports taking Tylenol  for pain with relief. Reports having a lot going on lately. States grandson recently passed and funeral is this upcoming Saturday. Reports having doctor appointment scheduled tomorrow for hip evaluation. Reports chronic hip pain.   Interventions: Performed Aging Gracefully exercises and Kristin Ochoa partially performed return demonstration. Provided home exercise booklet. Encouraged Mrs Ochoa to do exercises as able without injuring herself or causing more pain. Offered condolences for her grandson.   Plan: Scheduled next home visit for June 30th at 10 am.    03/08/2024  Assessment: Kristin Ochoa and her husband are staying temporarily at their grandson's home while their home is being worked on by  [employer]. Kristin Ochoa reports having standing water under their home. States they were unaware of the standing water prior to [facility] discovering it. Kristin Ochoa states her husband got admitted to the hospital last night. Kristin Ochoa reports has been very busy between temporarily moving into her grandson's home and her husband's health challenges. Denies pain or recent falls. States pain has gotten better as of late. States she has been more active due to the recent temporary move. States she was recently informed by ortho surgeon that her pain is likely coming from her back not hip. States if pain comes back, the doctor may give her a steroid injection. States she doesn't need injection at this time. States her rheumatologist was yesterday. No changes made.  Interventions: Encouraged Mrs. Durkee to pace herself and rest when able. Encouraged Mrs. Riesgo to increase water intake and stay indoors as much as she can during this time of heat advisories. Encouraged Mrs. College to practice home exercises and staying active safely.   Plan: Scheduled next home visit for August 27th at 12noon.   Pablo Hurst, MSN, RN, BSN [place]  [facility], Healthy Communities RN Case Manager for Aging  Gracefully Direct Dial: [phone]      04/05/24  Assessment: Mrs. Duchemin is doing well overall. States her main concern is Mr. Andaya  health. She is happy Mr. Borrero is back home from the hospital. Reports pain 0 out of 10. Denies any falls. Reports she plans to schedule steroid injection for back pain. States she has been in pain lately. However, current pain level is 0.  Interventions: Encouraged Mrs. Mckain to consider using a heating pad for back pain. Encouraged ongoing movement and activity including AG home exercises. Made Mrs. Willhoite aware today is last AG RN home visit with her.  Plan: Will send notification to AG team of writer's final meeting with Mrs. Alm.  Goal met   Pablo Hurst, MSN, RN, BSN [facility]  [facility], Healthy Communities RN Case Manager for Aging Gracefully Direct Dial: [phone]                                                                                 Pablo Hurst, MSN, RN, BSN [place]  [facility], Healthy Communities RN Case Manager for Aging Gracefully Direct Dial: [phone]

## 2024-04-05 NOTE — Patient Instructions (Signed)
 Visit Information  Thank you for taking time to visit with me today. Please don't hesitate to contact me if I can be of assistance to you before our next scheduled home appointment.  Following are the goals we discussed today:   Goals Addressed               This Visit's Progress     COMPLETED: AG RN (pt-stated)        12/03/23  Assessment: Kristin Ochoa reports having chronic pain from arthritis. Reports worsening hip pain 7 out of 10. States she plans to contact doctor for further follow up. Reports taking Tylenol  for pain.   Interventions: Discussed pain relief strategies. Encouraged to contact PCP for unrelieved pain. Provided chronic disease booklet, writer's contact information, and calendar.   Plan: Scheduled next home visit on May 27th at 10 am. Joint visit with Mr. Vazques.   CLIENT/RN ACTION PLAN - PAIN  Registered Nurse:  Pablo Hurst Date: 12/03/23  Client Name: Kristin Ochoa Client ID:    Target Area:  PAIN   Why Problem May Occur: Arthritis    Target Goal: Decrease in level of pain over the next 160 days   STRATEGIES Coping Strategies Ideas:  Heat  Reviewed 12/03/23 Reviewed 01/06/24 Reviewed 03/08/24 Reviewed 04/05/24   Use heating pad or warm towel on painful area no more than 20 minutes at a time. Don't sleep with a heating pad on - it could burn your skin   Ice  Reviewed 12/03/23 Reviewed 01/06/24 Reviewed 03/08/24 Reviewed 04/05/24 Use ice pack or frozen bag of vegetables on painful area.   Leave cold pack on for less than 20 minutes. Ice can burn your skin.  Don't leave ice on longer than 20 minutes.   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 Turn off the TV and turn on the radio.   Prayer/Meditation Reviewed 12/03/23 Reviewed 01/06/24 Reviewed 03/08/24 Reviewed 04/05/24 Prayer and meditation can decrease pain   Other   Acetaminophen /Tylenol  (same medication)  Reviewed 12/03/23 Reviewed 01/06/24 Reviewed 03/08/24 Reviewed 04/05/24 DO NOT take more Acetaminophen  than below because it can be bad for your liver. 500 mg. tablets:  2 tablets every 8 hours, as needed for pain.  Do not take more than 6, (500 mg) tablets every day. 300 mg. tablets:  2 tablets every 6 hours, as needed for pain.  Do not take more than 8 (325 mg) tablets every day. Look for Acetaminophen /Tylenol  in other medicines you buy over the counter.   Still in Pain  Reviewed 12/03/23 Reviewed 01/06/24 Reviewed 03/08/24 Reviewed 04/05/24 There ae a lot of different kinds of pain medicines:  creams, patches and supplements. Ask your Healthcare Provider about other pain medications.   Stop Smoking Smoking can make arthritis worse.   Stop Pain Reviewed 12/03/23 Reviewed 01/06/24 Reviewed 03/08/24 Reviewed 04/05/24 Before it gets bad. Once in pain It is harder to get rid of. Begin pain relief while you have mild pain.   Other   Other    ;  PRACTICE It is important to practice the strategies so we can determine if they will be effective in helping to reach the goal.    Follow these specific recommendations:  If strategy does not work the first time, try it again.  We may make some changes over the next few sessions.    We may make some changes over the next few sessions, based on how they work.   Pablo Hurst, MSN, RN, BSN [facility], Healthy Communities RN Case Manager for Aging Gracefully Direct Dial: [phone]     01/06/24  Assessment: Kristin Ochoa reports taking Tylenol  for pain with relief. Reports having a lot going on lately. States grandson recently passed and funeral is this upcoming  Saturday. Reports having doctor appointment scheduled tomorrow for hip evaluation. Reports chronic hip pain.   Interventions: Performed Aging Gracefully exercises and Mrs. Wingard partially performed return demonstration. Provided home exercise booklet. Encouraged Mrs Farha to do exercises as able without injuring herself or causing more pain. Offered condolences for her grandson.   Plan: Scheduled next home visit for June 30th at 10 am.    03/08/2024  Assessment: Kristin Ochoa and her husband are staying temporarily at their grandson's home while their home is being worked on by [employer]. [name] reports having standing water under their home. States they were unaware of the standing water prior to [facility] discovering it. [name] states her husband got admitted to the hospital last night. Kristin Ochoa reports has been very busy between temporarily moving into her grandson's home and her husband's health challenges. Denies pain or recent falls. States pain has gotten better as of late. States she has been more active due to the recent temporary move. States she was recently informed by ortho surgeon that her pain is likely coming from her back not hip. States if pain comes back, the doctor may give her a steroid injection. States she doesn't need injection at this time. States her rheumatologist was yesterday. No changes made.  Interventions: Encouraged Kristin Ochoa to pace herself and rest when able. Encouraged Kristin Ochoa to increase water intake and stay indoors as much as she can during this time of heat advisories. Encouraged Kristin Ochoa to practice home exercises and staying active safely.   Plan: Scheduled next home visit for August 27th at 12noon.   Pablo Hurst, MSN, RN, BSN [facility], Healthy Communities RN Case Manager for Aging Gracefully Direct Dial: [phone]      04/05/24  Assessment: Kristin Ochoa is doing well overall. States her main concern is Mr. Brashear   health. She is happy Mr. Birkeland is back home from the hospital. Reports pain 0 out of 10. Denies any falls. Reports she plans to schedule steroid injection for back pain. States she has been in pain lately. However, current pain level is 0.  Interventions: Encouraged Mrs. Elsberry to consider using a heating pad for back pain. Encouraged ongoing movement and activity including AG home exercises. Made Mrs. Stanish aware today is last AG RN home visit with her.  Plan: Will send notification to AG team of writer's final meeting with Mrs. Ochoa.  Goal met   Pablo Hurst, MSN, RN, BSN [place]  [facility], Healthy Communities RN Case Manager for Aging Gracefully Direct Dial: [phone]  If you are experiencing a Mental Health or Behavioral Health Crisis or need someone to talk to, please call the Suicide and Crisis Lifeline: 988 call the [place]  National Suicide Prevention Lifeline: (307)089-9327 or TTY: 220-836-9976 TTY (417) 558-0738) to talk to a trained counselor call 1-800-273-TALK (toll free, 24 hour hotline) go to [facility] [address], [place] [phone]) call 911   The patient verbalized understanding of instructions, educational materials, and care plan provided today and agreed to receive a mailed copy of patient instructions, educational materials, and care plan.   Pablo Hurst, MSN, RN, BSN [place]  [facility], Healthy Communities RN Case Manager for Aging Gracefully Direct Dial: [phone]

## 2024-06-02 ENCOUNTER — Other Ambulatory Visit: Payer: Self-pay

## 2024-06-02 NOTE — Patient Outreach (Signed)
 Aging Gracefully Program  06/02/2024  Kristin Ochoa [birth year] [id number]   [facility] Evaluation Interviewer attempted to call patient on today regarding Aging Gracefully referral. No answer from patient after multiple rings. CMA left confidential voicemail for patient to return call.  Will attempt to call back within 1 week.    S[facility]  Population Health Care Management Assistant  Direct Dial: [phone]  Fax: [phone] Website: [email or web address]

## 2024-06-23 ENCOUNTER — Other Ambulatory Visit: Payer: Self-pay

## 2024-06-23 NOTE — Patient Outreach (Signed)
 Aging Gracefully Program  06/23/2024  Aliviyah Schlachter 04-15-1946 [id number]   [facility] Evaluation Interviewer attempted to call patient on today regarding Aging Gracefully referral. No answer from patient after multiple rings. CMA left confidential voicemail for patient to return call.  Will attempt to call back within 1 week.    [facility]  Population Health Care Management Assistant  Direct Dial: [phone]  Fax: [phone] Website: [email or web address]

## 2024-08-25 ENCOUNTER — Other Ambulatory Visit: Payer: Self-pay

## 2024-08-25 NOTE — Patient Outreach (Signed)
 Aging Gracefully Program  08/25/2024  Kristin Ochoa 1945-09-16 [id number]   [facility] Evaluation Interviewer attempted to call patient on today regarding Aging Gracefully referral. No answer from patient after multiple rings. CMA left confidential voicemail for patient to return call.  4th attempt will send back to [facility].   S[facility]en [facility]  Population Health Care Management Assistant  Direct Dial: [phone]  Fax: [phone] Website: [email or web address]

## 2024-08-26 ENCOUNTER — Other Ambulatory Visit: Payer: Self-pay

## 2024-08-26 ENCOUNTER — Telehealth: Payer: Self-pay

## 2024-08-26 NOTE — Patient Outreach (Signed)
 Aging Gracefully Program  08/26/2024  Kristin Ochoa Oct 23, 1945 [id number]   [facility] Evaluation Interviewer made contact with patient. Aging Gracefully 5 month survey completed.      [facility]  Population Health Care Management Assistant  Direct Dial: [phone]  Fax: [phone] Website: [email or web address]
